# Patient Record
Sex: Male | Born: 1970 | Race: Black or African American | Hispanic: No | Marital: Married | Smoking: Never smoker
Health system: Southern US, Community
[De-identification: ages and names within clinical notes are randomized; demographics above are authoritative.]

## PROBLEM LIST (undated history)

## (undated) DIAGNOSIS — I10 Essential (primary) hypertension: Secondary | ICD-10-CM

## (undated) DIAGNOSIS — M5430 Sciatica, unspecified side: Secondary | ICD-10-CM

## (undated) DIAGNOSIS — R001 Bradycardia, unspecified: Secondary | ICD-10-CM

## (undated) DIAGNOSIS — L409 Psoriasis, unspecified: Secondary | ICD-10-CM

## (undated) DIAGNOSIS — M549 Dorsalgia, unspecified: Secondary | ICD-10-CM

## (undated) HISTORY — PX: SHOULDER SURGERY: SHX246

## (undated) HISTORY — DX: Essential (primary) hypertension: I10

---

## 1997-11-08 ENCOUNTER — Encounter: Admission: RE | Admit: 1997-11-08 | Discharge: 1997-11-08 | Payer: Self-pay | Admitting: *Deleted

## 1997-12-21 ENCOUNTER — Encounter: Admission: RE | Admit: 1997-12-21 | Discharge: 1997-12-21 | Payer: Self-pay | Admitting: *Deleted

## 1998-11-03 ENCOUNTER — Emergency Department (HOSPITAL_COMMUNITY): Admission: EM | Admit: 1998-11-03 | Discharge: 1998-11-03 | Payer: Self-pay

## 1999-06-16 ENCOUNTER — Emergency Department (HOSPITAL_COMMUNITY): Admission: EM | Admit: 1999-06-16 | Discharge: 1999-06-16 | Payer: Self-pay | Admitting: Emergency Medicine

## 2011-11-19 ENCOUNTER — Emergency Department (HOSPITAL_COMMUNITY)
Admission: EM | Admit: 2011-11-19 | Discharge: 2011-11-19 | Disposition: A | Discharge status: Home / Self Care | Payer: Self-pay | Attending: Emergency Medicine | Admitting: Emergency Medicine

## 2011-11-19 ENCOUNTER — Encounter (HOSPITAL_COMMUNITY): Payer: Self-pay | Admitting: *Deleted

## 2011-11-19 DIAGNOSIS — Y998 Other external cause status: Secondary | ICD-10-CM | POA: Insufficient documentation

## 2011-11-19 DIAGNOSIS — X503XXA Overexertion from repetitive movements, initial encounter: Secondary | ICD-10-CM | POA: Insufficient documentation

## 2011-11-19 DIAGNOSIS — T148XXA Other injury of unspecified body region, initial encounter: Secondary | ICD-10-CM

## 2011-11-19 DIAGNOSIS — IMO0002 Reserved for concepts with insufficient information to code with codable children: Secondary | ICD-10-CM | POA: Insufficient documentation

## 2011-11-19 DIAGNOSIS — Y9389 Activity, other specified: Secondary | ICD-10-CM | POA: Insufficient documentation

## 2011-11-19 DIAGNOSIS — M549 Dorsalgia, unspecified: Secondary | ICD-10-CM

## 2011-11-19 MED ORDER — DIAZEPAM 5 MG PO TABS: ORAL_TABLET | ORAL | Status: AC

## 2011-11-19 MED ORDER — KETOROLAC TROMETHAMINE 30 MG/ML IJ SOLN
INTRAMUSCULAR | Status: AC
  Administered 2011-11-19: 60 mg
  Filled 2011-11-19: qty 2

## 2011-11-19 MED ORDER — ONDANSETRON 4 MG PO TBDP: 4.0000 mg | ORAL_TABLET | Freq: Once | ORAL | Status: DC

## 2011-11-19 MED ORDER — KETOROLAC TROMETHAMINE 60 MG/2ML IM SOLN: 60.0000 mg | Freq: Once | INTRAMUSCULAR | Status: DC

## 2011-11-19 MED ORDER — PREDNISONE 20 MG PO TABS: 60.0000 mg | ORAL_TABLET | Freq: Once | ORAL | Status: DC

## 2011-11-19 MED ORDER — CEFTRIAXONE SODIUM 1 G IJ SOLR: 1.0000 g | Freq: Once | INTRAMUSCULAR | Status: DC

## 2011-11-19 MED ORDER — ACETAMINOPHEN-CODEINE #3 300-30 MG PO TABS: 1.0000 | ORAL_TABLET | Freq: Four times a day (QID) | ORAL | Status: AC | PRN

## 2011-11-19 MED ORDER — MORPHINE SULFATE 4 MG/ML IJ SOLN: 4.0000 mg | Freq: Once | INTRAMUSCULAR | Status: DC

## 2011-11-19 NOTE — ED Provider Notes (Signed)
History     CSN: [id number]  Arrival date & time 11/19/11  1905   First MD Initiated Contact with Patient 11/19/11 2053      Chief Complaint  Patient presents with  . Back Pain    (Consider location/radiation/quality/duration/timing/severity/associated sxs/prior treatment) HPI  Patient presents to ER complaining of lower back pain that was gradual onset, persistent and unchanging stating that Friday afternoon he was doing a lot of lifting of boxes filled with paper and twisting to put them away and by the time he got home Friday, 4 days ago he had aching in is lower back. Patient states that since then the pain has begun to radiate from around lower back towards groin. Pain is aggravated by bending from waist and twisting and mildly improved with lying down and taking ibuprofen. Patient states he last took ibuprofen yesterday. He denies any extremity numbness/tingling/weakness, loss of bowel or bladder function or saddle seat paresthesias. He denies abdominal pain or mass in groin.   History reviewed. No pertinent past medical history.  History reviewed. No pertinent past surgical history.  History reviewed. No pertinent family history.  History  Substance Use Topics  . Smoking status: Never Smoker   . Smokeless tobacco: Not on file  . Alcohol Use: No      Review of Systems  All other systems reviewed and are negative.    Allergies  Review of patient's allergies indicates no known allergies.  Home Medications   Current Outpatient Rx  Name Route Sig Dispense Refill  . ACETAMINOPHEN-CODEINE #3 300-30 MG PO TABS Oral Take 1-2 tablets by mouth every 6 (six) hours as needed for pain. 15 tablet 0  . DIAZEPAM 5 MG PO TABS  Take 1 tablet by mouth every 4-6 hours as needed for muscle relaxation 15 tablet 0    BP 126/84  Temp 98 F (36.7 C) (Oral)  Resp 16  SpO2 99%  Physical Exam  Nursing note and vitals reviewed. Constitutional: He is oriented to person, place, and  time. He appears well-developed and well-nourished. No distress.  HENT:  Head: Normocephalic and atraumatic.  Eyes: Conjunctivae are normal.  Neck: Normal range of motion. Neck supple.  Cardiovascular: Normal rate, regular rhythm, normal heart sounds and intact distal pulses.  Exam reveals no gallop and no friction rub.   No murmur heard. Pulmonary/Chest: Effort normal and breath sounds normal. No respiratory distress. He has no wheezes. He has no rales. He exhibits no tenderness.  Abdominal: Bowel sounds are normal. He exhibits no distension and no mass. There is no tenderness. There is no rebound and no guarding.  Genitourinary: Penis normal.       Inguinal mass or hernia. Testes NT.   Musculoskeletal: Normal range of motion. He exhibits tenderness. He exhibits no edema.       TTP of soft tissue of lower lumbar back with palpation but no skin changes or crepitous. Pain in lower back with bending and twisting from waist. 5/5 strength of bilateral UE and LE with normal reflexes. Negative straight leg raise.   Neurological: He is alert and oriented to person, place, and time. He has normal reflexes.  Skin: Skin is warm and dry. No rash noted. He is not diaphoretic. No erythema.  Psychiatric: He has a normal mood and affect.    ED Course  Procedures (including critical care time)  IM toradol.   Labs Reviewed - No data to display No results found.   1. Back pain   2.  Muscle strain       MDM  No red flags for back pain. No signs of symptoms of central cord compression or cauda equina. Symptoms consistent with straining of lower lumbar back. Ambulating without difficulty. No TTP of abdomen with out palpable masses.         Kendall West, [place] 11/19/11 2145

## 2011-11-19 NOTE — ED Notes (Signed)
Patient was lifting boxes and injured his back Friday.  Pain has gotten worse and now it is radiating to his groin area

## 2011-11-19 NOTE — ED Notes (Signed)
Medication given in L buttocks

## 2011-11-20 NOTE — ED Provider Notes (Signed)
Medical screening examination/treatment/procedure(s) were performed by non-physician practitioner and as supervising physician I was immediately available for consultation/collaboration.   Carleene Cooper III, MD 11/20/11 [phone]

## 2013-12-01 ENCOUNTER — Ambulatory Visit (INDEPENDENT_AMBULATORY_CARE_PROVIDER_SITE_OTHER): Payer: Self-pay | Admitting: Emergency Medicine

## 2013-12-01 ENCOUNTER — Encounter: Payer: Self-pay | Admitting: *Deleted

## 2013-12-01 VITALS — BP 118/84 | HR 67 | Temp 97.8°F | Resp 16 | Ht 71.5 in | Wt 242.0 lb

## 2013-12-01 DIAGNOSIS — Z0000 Encounter for general adult medical examination without abnormal findings: Secondary | ICD-10-CM

## 2013-12-01 NOTE — Progress Notes (Signed)
   Subjective:    Patient ID: Keith Austin, male    DOB: 07/25/1970, 43 y.o.   MRN: [id number] This chart was scribed for Collene GobbleSteven A Daub, MD by Julian HyMorgan Graham, ED Scribe. The patient was seen in Room 21. The patient's care was started at 5:07 PM.    HPI HPI Comments: Keith CopaSteven Archambeault is a 43 y.o. 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  Review of Systems  Constitutional: Negative for fever and chills.  Respiratory: Negative for shortness of breath.   Gastrointestinal: Negative for nausea and vomiting.  Neurological: Negative for weakness.   No past medical history on file. No past surgical history on file. No Known Allergies No current outpatient prescriptions on file.   No current facility-administered medications for this visit.   Objective:  Triage Vitals: BP 118/84  Pulse 67  Temp(Src) 97.8 F (36.6 C) (Oral)  Resp 16  Ht 5' 11.5" (1.816 m)  Wt 242 lb (109.77 kg)  BMI 33.29 kg/m2  SpO2 99%  Physical Exam .CONSTITUTIONAL: Well developed/well nourished HEAD: Normocephalic/atraumatic EYES: EOMI/PERRL ENMT: Mucous membranes moist NECK: supple no meningeal signs SPINE:entire spine nontender CV: S1/S2 noted, no murmurs/rubs/gallops noted LUNGS: Lungs are clear to auscultation bilaterally, no apparent distress ABDOMEN: soft, nontender, no rebound or guarding GU:no cva tenderness NEURO: Pt is awake/alert, moves all extremitiesx4 EXTREMITIES: pulses normal, full ROM SKIN: warm, color normal PSYCH: no abnormalities of mood noted  Assessment & Plan:  5:11 PM- Patient informed of current plan for treatment and evaluation and agrees with plan at this time.  His physical exam is normal and he qualifies for a 2 year card.

## 2014-03-16 ENCOUNTER — Other Ambulatory Visit: Payer: Self-pay | Admitting: Emergency Medicine

## 2014-03-16 LAB — CBC WITH DIFFERENTIAL/PLATELET
BASOS PCT: 0 % (ref 0–1)
Basophils Absolute: 0 10*3/uL (ref 0.0–0.1)
EOS ABS: 0.1 10*3/uL (ref 0.0–0.7)
EOS PCT: 1 % (ref 0–5)
HEMATOCRIT: 42.1 % (ref 39.0–52.0)
HEMOGLOBIN: 14 g/dL (ref 13.0–17.0)
LYMPHS ABS: 0.9 10*3/uL (ref 0.7–4.0)
Lymphocytes Relative: 12 % (ref 12–46)
MCH: 30.5 pg (ref 26.0–34.0)
MCHC: 33.3 g/dL (ref 30.0–36.0)
MCV: 91.7 fL (ref 78.0–100.0)
MONO ABS: 0.6 10*3/uL (ref 0.1–1.0)
MONOS PCT: 8 % (ref 3–12)
MPV: 10.1 fL (ref 9.4–12.4)
Neutro Abs: 5.8 10*3/uL (ref 1.7–7.7)
Neutrophils Relative %: 79 % — ABNORMAL HIGH (ref 43–77)
Platelets: 243 10*3/uL (ref 150–400)
RBC: 4.59 MIL/uL (ref 4.22–5.81)
RDW: 13.7 % (ref 11.5–15.5)
WBC: 7.3 10*3/uL (ref 4.0–10.5)

## 2014-03-16 LAB — COMPLETE METABOLIC PANEL WITH GFR
ALT: 29 U/L (ref 0–53)
AST: 29 U/L (ref 0–37)
Albumin: 4.5 g/dL (ref 3.5–5.2)
Alkaline Phosphatase: 49 U/L (ref 39–117)
BILIRUBIN TOTAL: 0.7 mg/dL (ref 0.2–1.2)
BUN: 14 mg/dL (ref 6–23)
CO2: 28 meq/L (ref 19–32)
CREATININE: 1.42 mg/dL — AB (ref 0.50–1.35)
Calcium: 9.3 mg/dL (ref 8.4–10.5)
Chloride: 102 mEq/L (ref 96–112)
GFR, Est African American: 68 mL/min
GFR, Est Non African American: 59 mL/min — ABNORMAL LOW
Glucose, Bld: 77 mg/dL (ref 70–99)
Potassium: 4.3 mEq/L (ref 3.5–5.3)
SODIUM: 138 meq/L (ref 135–145)
TOTAL PROTEIN: 7.5 g/dL (ref 6.0–8.3)

## 2016-11-26 ENCOUNTER — Other Ambulatory Visit: Payer: Self-pay | Admitting: Occupational Medicine

## 2016-11-26 ENCOUNTER — Ambulatory Visit: Payer: Self-pay

## 2016-11-26 DIAGNOSIS — M545 Low back pain: Secondary | ICD-10-CM

## 2017-03-20 ENCOUNTER — Ambulatory Visit: Payer: Self-pay | Admitting: Orthopedic Surgery

## 2017-03-20 NOTE — H&P (Signed)
Keith Austin is an 46 y.o. male.   Chief Complaint: back and right leg pain HPI: The patient is a 46 year old male who presents today for follow up of their back. The patient is being followed for their low back symptoms. They are now 16 weeks, 4 days out from injury (DOI 10/26/16). Symptoms reported today include: pain. Current treatment includes: home exercise program, activity modification, NSAIDs (Meloxicam) and Neurontin. The patient reports their current pain level to be 4 / 10 (but can be 10/10). The patient presents today following right L5 SNRB x 2 weeks. The patient has not gotten any relief of their symptoms with Cortisone injections. Note for "Follow-up back": The patient curently has light duty resctrictions of no lifting over 5lbs, no bending, stooping, or squatting, no prolonged sitting or standing, and sedentary work only.  Note:Patient reports temporary relief from the injection on the right. A selective nerve root block. The pain was significantly better now it has returned it is in the buttock thigh calf and into the top of the foot. Especially when he stands or lifts any objects. He is 4 months status post his injury. He reports no problems with his back prior to that. He is on a light duty position at work. His main problem is he has to walk long distances at the [facility] and that is intolerable for him he tolerates about 10 minutes of walking.  No past medical history on file.  No past surgical history on file.  No family history on file. Social History:  reports that  has never smoked. He does not have any smokeless tobacco history on file. He reports that he does not drink alcohol. His drug history is not on file.  Allergies: No Known Allergies   (Not in a hospital admission)  No results found for this or any previous visit (from the past 48 hour(s)). No results found.  Review of Systems  Constitutional: Negative.   HENT: Negative.   Eyes:  Negative.   Respiratory: Negative.   Cardiovascular: Negative.   Gastrointestinal: Negative.   Genitourinary: Negative.   Musculoskeletal: Positive for back pain.  Skin: Negative.   Neurological: Positive for sensory change and focal weakness.  Psychiatric/Behavioral: Negative.     There were no vitals taken for this visit. Physical Exam  Constitutional: He is oriented to person, place, and time. He appears well-developed.  HENT:  Head: Normocephalic.  Eyes: Pupils are equal, round, and reactive to light.  Neck: Normal range of motion.  Cardiovascular: Normal rate.  Respiratory: Effort normal.  GI: Soft.  Musculoskeletal:  Moderate distress. Tender in right proximal gluteus. Straight leg raise positive on the right. Negative on the left. EHL 4 +/5 on the right compared to the left. Decreased sensation L5 dermatome. No instability hips knees and ankles. He is normoreflexic except in the right Achilles which he has slightly diminished on the right compared to the left. No DVT. Pulses are intact. Pelvis is stable. Abdomen soft. No flank pain with percussion  Cervical spine full painless range. Motor is 5 5 in the upper extremities including biceps, triceps, brachioradialis, wrist extension, wrist flexion. Sensory exams intact no Hoffmann sign.  Neurological: He is alert and oriented to person, place, and time.  Skin: Skin is warm and dry.     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 He has on  clinical exam and L5 radiculopathy with EHL weakness and decreased sensation in the L5 dermatome. He had temporary relief from a selective nerve root block L5 on the right. He does have disc degeneration L5-S1 that is of moderate. He however has no back pain.  Repeated radiographs today 3 view demonstrating no instability at L5-S1. No pars defect he  has a moderate disc degeneration and disc space narrowing L5-S1 with narrow foraminal narrowing.  I discussed at 4 months status post his injury either living with his symptoms rating him in releasing him and referring him to pain management versus consideration of a microlumbar decompression L5-S1 on the right with a foraminotomy of of L5 decompressing the lateral recess and evaluating the disc and a possible discectomy at L5-S1. Risk and benefits discussed including bleeding infection no changes in symptoms worsening in symptoms need for fusion in the future DVT PE anesthetic complications etc.  Procedure would require overnight stay. 2 weeks for suture removal 6 weeks until light duty an option he would be at maximum medical improvement 12 weeks following the surgery.  Again at this point I would not recommend no further injections as they were temporary. He has exhausted physical therapy with which was not beneficial. And he does have a neurologic deficit at this point in time.  I would not recommend a concomitant fusion despite his disc space narrowing given that he has no back pain. And he has no instability. Patient would like to proceed with that procedure at this point in time we will petition for authorization.  Plan microlumbar decompression L5-S1 right  BISSELL, JACLYN M., PA-C  For Dr. Beane 03/20/2017, 8:42 AM   

## 2017-03-20 NOTE — H&P (View-Only) (Signed)
Keith Austin is an 46 y.o. male.   Chief Complaint: back and right leg pain HPI: The patient is a 46 year old male who presents today for follow up of their back. The patient is being followed for their low back symptoms. They are now 16 weeks, 4 days out from injury (DOI 10/26/16). Symptoms reported today include: pain. Current treatment includes: home exercise program, activity modification, NSAIDs (Meloxicam) and Neurontin. The patient reports their current pain level to be 4 / 10 (but can be 10/10). The patient presents today following right L5 SNRB x 2 weeks. The patient has not gotten any relief of their symptoms with Cortisone injections. Note for "Follow-up back": The patient curently has light duty resctrictions of no lifting over 5lbs, no bending, stooping, or squatting, no prolonged sitting or standing, and sedentary work only.  Note:Patient reports temporary relief from the injection on the right. A selective nerve root block. The pain was significantly better now it has returned it is in the buttock thigh calf and into the top of the foot. Especially when he stands or lifts any objects. He is 4 months status post his injury. He reports no problems with his back prior to that. He is on a light duty position at work. His main problem is he has to walk long distances at the [facility] and that is intolerable for him he tolerates about 10 minutes of walking.  No past medical history on file.  No past surgical history on file.  No family history on file. Social History:  reports that  has never smoked. He does not have any smokeless tobacco history on file. He reports that he does not drink alcohol. His drug history is not on file.  Allergies: No Known Allergies   (Not in a hospital admission)  No results found for this or any previous visit (from the past 48 hour(s)). No results found.  Review of Systems  Constitutional: Negative.   HENT: Negative.   Eyes:  Negative.   Respiratory: Negative.   Cardiovascular: Negative.   Gastrointestinal: Negative.   Genitourinary: Negative.   Musculoskeletal: Positive for back pain.  Skin: Negative.   Neurological: Positive for sensory change and focal weakness.  Psychiatric/Behavioral: Negative.     There were no vitals taken for this visit. Physical Exam  Constitutional: He is oriented to person, place, and time. He appears well-developed.  HENT:  Head: Normocephalic.  Eyes: Pupils are equal, round, and reactive to light.  Neck: Normal range of motion.  Cardiovascular: Normal rate.  Respiratory: Effort normal.  GI: Soft.  Musculoskeletal:  Moderate distress. Tender in right proximal gluteus. Straight leg raise positive on the right. Negative on the left. EHL 4 +/5 on the right compared to the left. Decreased sensation L5 dermatome. No instability hips knees and ankles. He is normoreflexic except in the right Achilles which he has slightly diminished on the right compared to the left. No DVT. Pulses are intact. Pelvis is stable. Abdomen soft. No flank pain with percussion  Cervical spine full painless range. Motor is 5 5 in the upper extremities including biceps, triceps, brachioradialis, wrist extension, wrist flexion. Sensory exams intact no Hoffmann sign.  Neurological: He is alert and oriented to person, place, and time.  Skin: Skin is warm and dry.     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 He has on  clinical exam and L5 radiculopathy with EHL weakness and decreased sensation in the L5 dermatome. He had temporary relief from a selective nerve root block L5 on the right. He does have disc degeneration L5-S1 that is of moderate. He however has no back pain.  Repeated radiographs today 3 view demonstrating no instability at L5-S1. No pars defect he  has a moderate disc degeneration and disc space narrowing L5-S1 with narrow foraminal narrowing.  I discussed at 4 months status post his injury either living with his symptoms rating him in releasing him and referring him to pain management versus consideration of a microlumbar decompression L5-S1 on the right with a foraminotomy of of L5 decompressing the lateral recess and evaluating the disc and a possible discectomy at L5-S1. Risk and benefits discussed including bleeding infection no changes in symptoms worsening in symptoms need for fusion in the future DVT PE anesthetic complications etc.  Procedure would require overnight stay. 2 weeks for suture removal 6 weeks until light duty an option he would be at maximum medical improvement 12 weeks following the surgery.  Again at this point I would not recommend no further injections as they were temporary. He has exhausted physical therapy with which was not beneficial. And he does have a neurologic deficit at this point in time.  I would not recommend a concomitant fusion despite his disc space narrowing given that he has no back pain. And he has no instability. Patient would like to proceed with that procedure at this point in time we will petition for authorization.  Plan microlumbar decompression L5-S1 right  Dorothy SparkBISSELL, Keith Austin M., PA-C  For Dr. Shelle IronBeane 03/20/2017, 8:42 AM

## 2017-04-04 ENCOUNTER — Other Ambulatory Visit (HOSPITAL_COMMUNITY): Payer: Self-pay | Admitting: Emergency Medicine

## 2017-04-04 NOTE — Patient Instructions (Signed)
Melina CopaSteven Bouch  04/04/2017   Your procedure is scheduled on: 04-10-17  Report to [facility]  Entrance Take [place]  elevators to 3rd floor to  Short Stay Center at 630AM.   Call this number if you have problems the morning of surgery [phone]   Remember: ONLY 1 PERSON MAY GO WITH YOU TO SHORT STAY TO GET  READY MORNING OF YOUR SURGERY.  Do not eat food or drink liquids :After Midnight.     Take these medicines the morning of surgery with A SIP OF WATER: none                                You may not have any metal on your body including hair pins and              piercings  Do not wear jewelry, make-up, lotions, powders or perfumes, deodorant                          Men may shave face and neck.   Do not bring valuables to the hospital. [facility] IS NOT             RESPONSIBLE   FOR VALUABLES.  Contacts, dentures or bridgework may not be worn into surgery.  Leave suitcase in the car. After surgery it may be brought to your room.                   Please read over the following fact sheets you were given: _____________________________________________________________________             [facility] - Preparing for Surgery Before surgery, you can play an important role.  Because skin is not sterile, your skin needs to be as free of germs as possible.  You can reduce the number of germs on your skin by washing with CHG (chlorahexidine gluconate) soap before surgery.  CHG is an antiseptic cleaner which kills germs and bonds with the skin to continue killing germs even after washing. Please DO NOT use if you have an allergy to CHG or antibacterial soaps.  If your skin becomes reddened/irritated stop using the CHG and inform your nurse when you arrive at Short Stay. Do not shave (including legs and underarms) for at least 48 hours prior to the first CHG shower.  You may shave your face/neck. Please follow these instructions carefully:  1.  Shower  with CHG Soap the night before surgery and the  morning of Surgery.  2.  If you choose to wash your hair, wash your hair first as usual with your  normal  shampoo.  3.  After you shampoo, rinse your hair and body thoroughly to remove the  shampoo.                           4.  Use CHG as you would any other liquid soap.  You can apply chg directly  to the skin and wash                       Gently with a scrungie or clean washcloth.  5.  Apply the CHG Soap to your body ONLY FROM THE NECK DOWN.   Do not use on face/  open                           Wound or open sores. Avoid contact with eyes, ears mouth and genitals (private parts).                       Wash face,  Genitals (private parts) with your normal soap.             6.  Wash thoroughly, paying special attention to the area where your surgery  will be performed.  7.  Thoroughly rinse your body with warm water from the neck down.  8.  DO NOT shower/wash with your normal soap after using and rinsing off  the CHG Soap.                9.  Pat yourself dry with a clean towel.            10.  Wear clean pajamas.            11.  Place clean sheets on your bed the night of your first shower and do not  sleep with pets. Day of Surgery : Do not apply any lotions/deodorants the morning of surgery.  Please wear clean clothes to the hospital/surgery center.  FAILURE TO FOLLOW THESE INSTRUCTIONS MAY RESULT IN THE CANCELLATION OF YOUR SURGERY PATIENT SIGNATURE_________________________________  NURSE SIGNATURE__________________________________  ________________________________________________________________________   Adam Phenix  An incentive spirometer is a tool that can help keep your lungs clear and active. This tool measures how well you are filling your lungs with each breath. Taking long deep breaths may help reverse or decrease the chance of developing breathing (pulmonary) problems (especially infection) following:  A long period  of time when you are unable to move or be active. BEFORE THE PROCEDURE   If the spirometer includes an indicator to show your best effort, your nurse or respiratory therapist will set it to a desired goal.  If possible, sit up straight or lean slightly forward. Try not to slouch.  Hold the incentive spirometer in an upright position. INSTRUCTIONS FOR USE  1. Sit on the edge of your bed if possible, or sit up as far as you can in bed or on a chair. 2. Hold the incentive spirometer in an upright position. 3. Breathe out normally. 4. Place the mouthpiece in your mouth and seal your lips tightly around it. 5. Breathe in slowly and as deeply as possible, raising the piston or the ball toward the top of the column. 6. Hold your breath for 3-5 seconds or for as long as possible. Allow the piston or ball to fall to the bottom of the column. 7. Remove the mouthpiece from your mouth and breathe out normally. 8. Rest for a few seconds and repeat Steps 1 through 7 at least 10 times every 1-2 hours when you are awake. Take your time and take a few normal breaths between deep breaths. 9. The spirometer may include an indicator to show your best effort. Use the indicator as a goal to work toward during each repetition. 10. After each set of 10 deep breaths, practice coughing to be sure your lungs are clear. If you have an incision (the cut made at the time of surgery), support your incision when coughing by placing a pillow or rolled up towels firmly against it. Once you are able to get out of bed, walk around indoors and  cough well. You may stop using the incentive spirometer when instructed by your caregiver.  RISKS AND COMPLICATIONS  Take your time so you do not get dizzy or light-headed.  If you are in pain, you may need to take or ask for pain medication before doing incentive spirometry. It is harder to take a deep breath if you are having pain. AFTER USE  Rest and breathe slowly and easily.  It  can be helpful to keep track of a log of your progress. Your caregiver can provide you with a simple table to help with this. If you are using the spirometer at home, follow these instructions: [place] IF:   You are having difficultly using the spirometer.  You have trouble using the spirometer as often as instructed.  Your pain medication is not giving enough relief while using the spirometer.  You develop fever of 100.5 F (38.1 C) or higher. SEEK IMMEDIATE MEDICAL CARE IF:   You cough up bloody sputum that had not been present before.  You develop fever of 102 F (38.9 C) or greater.  You develop worsening pain at or near the incision site. MAKE SURE YOU:   Understand these instructions.  Will watch your condition.  Will get help right away if you are not doing well or get worse. Document Released: 08/20/2006 Document Revised: 07/02/2011 Document Reviewed: 10/21/2006 [facility] Patient Information 2014 [place], [place].   ________________________________________________________________________

## 2017-04-05 ENCOUNTER — Encounter (HOSPITAL_COMMUNITY)
Admission: RE | Admit: 2017-04-05 | Discharge: 2017-04-05 | Disposition: A | Discharge status: Home / Self Care | Payer: Worker's Compensation | Source: Ambulatory Visit | Attending: Specialist | Admitting: Specialist

## 2017-04-05 ENCOUNTER — Other Ambulatory Visit: Payer: Self-pay

## 2017-04-05 ENCOUNTER — Ambulatory Visit (HOSPITAL_COMMUNITY)
Admission: RE | Admit: 2017-04-05 | Discharge: 2017-04-05 | Disposition: A | Discharge status: Home / Self Care | Payer: Self-pay | Source: Ambulatory Visit | Attending: Orthopedic Surgery | Admitting: Orthopedic Surgery

## 2017-04-05 ENCOUNTER — Encounter (HOSPITAL_COMMUNITY): Payer: Self-pay

## 2017-04-05 DIAGNOSIS — M5126 Other intervertebral disc displacement, lumbar region: Secondary | ICD-10-CM

## 2017-04-05 DIAGNOSIS — M5127 Other intervertebral disc displacement, lumbosacral region: Secondary | ICD-10-CM | POA: Diagnosis not present

## 2017-04-05 DIAGNOSIS — Z01818 Encounter for other preprocedural examination: Secondary | ICD-10-CM | POA: Insufficient documentation

## 2017-04-05 DIAGNOSIS — M4807 Spinal stenosis, lumbosacral region: Secondary | ICD-10-CM | POA: Insufficient documentation

## 2017-04-05 HISTORY — DX: Dorsalgia, unspecified: M54.9

## 2017-04-05 HISTORY — DX: Sciatica, unspecified side: M54.30

## 2017-04-05 HISTORY — DX: Bradycardia, unspecified: R00.1

## 2017-04-05 HISTORY — DX: Psoriasis, unspecified: L40.9

## 2017-04-05 LAB — BASIC METABOLIC PANEL
Anion gap: 6 (ref 5–15)
BUN: 16 mg/dL (ref 6–20)
CALCIUM: 9.7 mg/dL (ref 8.9–10.3)
CO2: 29 mmol/L (ref 22–32)
CREATININE: 1.4 mg/dL — AB (ref 0.61–1.24)
Chloride: 107 mmol/L (ref 101–111)
GFR calc Af Amer: >60 mL/min (ref >60)
GFR calc non Af Amer: 58 mL/min — ABNORMAL LOW (ref >60)
GLUCOSE: 93 mg/dL (ref 65–99)
Potassium: 4.5 mmol/L (ref 3.5–5.1)
Sodium: 142 mmol/L (ref 135–145)

## 2017-04-05 LAB — CBC
HCT: 43.5 % (ref 39.0–52.0)
HEMOGLOBIN: 14.3 g/dL (ref 13.0–17.0)
MCH: 31.4 pg (ref 26.0–34.0)
MCHC: 32.9 g/dL (ref 30.0–36.0)
MCV: 95.6 fL (ref 78.0–100.0)
PLATELETS: 228 10*3/uL (ref 150–400)
RBC: 4.55 MIL/uL (ref 4.22–5.81)
RDW: 13.5 % (ref 11.5–15.5)
WBC: 6.9 10*3/uL (ref 4.0–10.5)

## 2017-04-05 LAB — SURGICAL PCR SCREEN
MRSA, PCR: NEGATIVE
Staphylococcus aureus: POSITIVE — AB

## 2017-04-08 ENCOUNTER — Ambulatory Visit: Payer: Self-pay | Admitting: Orthopedic Surgery

## 2017-04-10 ENCOUNTER — Encounter (HOSPITAL_COMMUNITY)
Admission: RE | Disposition: A | Discharge status: Home / Self Care | Payer: Self-pay | Source: Ambulatory Visit | Attending: Specialist

## 2017-04-10 ENCOUNTER — Ambulatory Visit (HOSPITAL_COMMUNITY)
Admission: RE | Admit: 2017-04-10 | Discharge: 2017-04-11 | Disposition: A | Discharge status: Home / Self Care | Payer: Worker's Compensation | Source: Ambulatory Visit | Attending: Specialist | Admitting: Specialist

## 2017-04-10 ENCOUNTER — Ambulatory Visit (HOSPITAL_COMMUNITY): Payer: Worker's Compensation

## 2017-04-10 ENCOUNTER — Ambulatory Visit (HOSPITAL_COMMUNITY): Payer: Worker's Compensation | Admitting: Certified Registered Nurse Anesthetist

## 2017-04-10 ENCOUNTER — Other Ambulatory Visit: Payer: Self-pay

## 2017-04-10 ENCOUNTER — Encounter (HOSPITAL_COMMUNITY): Payer: Self-pay

## 2017-04-10 DIAGNOSIS — Z79899 Other long term (current) drug therapy: Secondary | ICD-10-CM | POA: Insufficient documentation

## 2017-04-10 DIAGNOSIS — L409 Psoriasis, unspecified: Secondary | ICD-10-CM | POA: Insufficient documentation

## 2017-04-10 DIAGNOSIS — M5117 Intervertebral disc disorders with radiculopathy, lumbosacral region: Secondary | ICD-10-CM | POA: Insufficient documentation

## 2017-04-10 DIAGNOSIS — M4807 Spinal stenosis, lumbosacral region: Secondary | ICD-10-CM | POA: Insufficient documentation

## 2017-04-10 DIAGNOSIS — M48061 Spinal stenosis, lumbar region without neurogenic claudication: Secondary | ICD-10-CM | POA: Diagnosis present

## 2017-04-10 DIAGNOSIS — Z419 Encounter for procedure for purposes other than remedying health state, unspecified: Secondary | ICD-10-CM

## 2017-04-10 DIAGNOSIS — M5126 Other intervertebral disc displacement, lumbar region: Secondary | ICD-10-CM

## 2017-04-10 HISTORY — PX: LUMBAR LAMINECTOMY/DECOMPRESSION MICRODISCECTOMY: SHX5026

## 2017-04-10 SURGERY — LUMBAR LAMINECTOMY/DECOMPRESSION MICRODISCECTOMY 1 LEVEL
Anesthesia: General | Laterality: Right

## 2017-04-10 MED ORDER — ACETAMINOPHEN 10 MG/ML IV SOLN
INTRAVENOUS | Status: AC
  Filled 2017-04-10: qty 100

## 2017-04-10 MED ORDER — METHOCARBAMOL 500 MG PO TABS: 500.0000 mg | ORAL_TABLET | Freq: Four times a day (QID) | ORAL | 1 refills | Status: DC | PRN

## 2017-04-10 MED ORDER — OXYCODONE HCL 5 MG PO TABS
ORAL_TABLET | ORAL | Status: AC
  Filled 2017-04-10: qty 1

## 2017-04-10 MED ORDER — ONDANSETRON HCL 4 MG/2ML IJ SOLN
INTRAMUSCULAR | Status: AC
  Filled 2017-04-10: qty 2

## 2017-04-10 MED ORDER — ACETAMINOPHEN 10 MG/ML IV SOLN
1000.0000 mg | INTRAVENOUS | Status: AC
  Administered 2017-04-10: 1000 mg via INTRAVENOUS

## 2017-04-10 MED ORDER — OXYCODONE-ACETAMINOPHEN 5-325 MG PO TABS
1.0000 | ORAL_TABLET | ORAL | Status: DC | PRN
  Administered 2017-04-10 – 2017-04-11 (×3): 1 via ORAL
  Filled 2017-04-10 (×3): qty 1

## 2017-04-10 MED ORDER — BUPIVACAINE-EPINEPHRINE 0.5% -1:200000 IJ SOLN
INTRAMUSCULAR | Status: DC | PRN
  Administered 2017-04-10: 12 mL

## 2017-04-10 MED ORDER — ONDANSETRON HCL 4 MG/2ML IJ SOLN
INTRAMUSCULAR | Status: DC | PRN
  Administered 2017-04-10: 4 mg via INTRAVENOUS

## 2017-04-10 MED ORDER — PHENOL 1.4 % MT LIQD: 1.0000 | OROMUCOSAL | Status: DC | PRN

## 2017-04-10 MED ORDER — DEXAMETHASONE SODIUM PHOSPHATE 10 MG/ML IJ SOLN
INTRAMUSCULAR | Status: DC | PRN
  Administered 2017-04-10: 5 mg via INTRAVENOUS

## 2017-04-10 MED ORDER — THROMBIN (RECOMBINANT) 5000 UNITS EX SOLR
CUTANEOUS | Status: AC
  Filled 2017-04-10: qty 10000

## 2017-04-10 MED ORDER — ACETAMINOPHEN 325 MG PO TABS: 650.0000 mg | ORAL_TABLET | ORAL | Status: DC | PRN

## 2017-04-10 MED ORDER — DOCUSATE SODIUM 100 MG PO CAPS: 100.0000 mg | ORAL_CAPSULE | Freq: Two times a day (BID) | ORAL | 1 refills | Status: DC | PRN

## 2017-04-10 MED ORDER — ACETAMINOPHEN 650 MG RE SUPP: 650.0000 mg | RECTAL | Status: DC | PRN

## 2017-04-10 MED ORDER — PROPOFOL 10 MG/ML IV BOLUS
INTRAVENOUS | Status: AC
  Filled 2017-04-10: qty 20

## 2017-04-10 MED ORDER — POLYETHYLENE GLYCOL 3350 17 G PO PACK: 17.0000 g | PACK | Freq: Every day | ORAL | Status: DC | PRN

## 2017-04-10 MED ORDER — BISACODYL 5 MG PO TBEC: 5.0000 mg | DELAYED_RELEASE_TABLET | Freq: Every day | ORAL | Status: DC | PRN

## 2017-04-10 MED ORDER — THROMBIN (RECOMBINANT) 5000 UNITS EX SOLR
CUTANEOUS | Status: DC | PRN
  Administered 2017-04-10 (×2): 5 mL via TOPICAL

## 2017-04-10 MED ORDER — CEFAZOLIN SODIUM-DEXTROSE 2-4 GM/100ML-% IV SOLN
INTRAVENOUS | Status: AC
  Filled 2017-04-10: qty 100

## 2017-04-10 MED ORDER — OXYCODONE-ACETAMINOPHEN 5-325 MG PO TABS: 1.0000 | ORAL_TABLET | ORAL | 0 refills | Status: DC | PRN

## 2017-04-10 MED ORDER — ALUM & MAG HYDROXIDE-SIMETH 200-200-20 MG/5ML PO SUSP: 30.0000 mL | Freq: Four times a day (QID) | ORAL | Status: DC | PRN

## 2017-04-10 MED ORDER — MIDAZOLAM HCL 2 MG/2ML IJ SOLN
INTRAMUSCULAR | Status: AC
  Filled 2017-04-10: qty 2

## 2017-04-10 MED ORDER — RISAQUAD PO CAPS
1.0000 | ORAL_CAPSULE | Freq: Every day | ORAL | Status: DC
  Administered 2017-04-10 – 2017-04-11 (×2): 1 via ORAL
  Filled 2017-04-10 (×2): qty 1

## 2017-04-10 MED ORDER — KCL IN DEXTROSE-NACL 20-5-0.45 MEQ/L-%-% IV SOLN
INTRAVENOUS | Status: AC
  Administered 2017-04-10: 12:00:00 via INTRAVENOUS
  Filled 2017-04-10: qty 1000

## 2017-04-10 MED ORDER — CEFAZOLIN SODIUM-DEXTROSE 2-4 GM/100ML-% IV SOLN
2.0000 g | INTRAVENOUS | Status: AC
  Administered 2017-04-10: 2 g via INTRAVENOUS

## 2017-04-10 MED ORDER — MAGNESIUM CITRATE PO SOLN: 1.0000 | Freq: Once | ORAL | Status: DC | PRN

## 2017-04-10 MED ORDER — HYDROMORPHONE HCL 1 MG/ML IJ SOLN: 0.2500 mg | INTRAMUSCULAR | Status: DC | PRN

## 2017-04-10 MED ORDER — PHENYLEPHRINE 40 MCG/ML (10ML) SYRINGE FOR IV PUSH (FOR BLOOD PRESSURE SUPPORT)
PREFILLED_SYRINGE | INTRAVENOUS | Status: DC | PRN
  Administered 2017-04-10: 80 ug via INTRAVENOUS
  Administered 2017-04-10: 120 ug via INTRAVENOUS
  Administered 2017-04-10 (×2): 80 ug via INTRAVENOUS
  Administered 2017-04-10: 120 ug via INTRAVENOUS
  Administered 2017-04-10 (×3): 80 ug via INTRAVENOUS
  Administered 2017-04-10: 120 ug via INTRAVENOUS

## 2017-04-10 MED ORDER — MIDAZOLAM HCL 5 MG/5ML IJ SOLN
INTRAMUSCULAR | Status: DC | PRN
  Administered 2017-04-10: 2 mg via INTRAVENOUS

## 2017-04-10 MED ORDER — GABAPENTIN 300 MG PO CAPS: 300.0000 mg | ORAL_CAPSULE | Freq: Three times a day (TID) | ORAL | Status: DC | PRN

## 2017-04-10 MED ORDER — CLINDAMYCIN PHOSPHATE 900 MG/50ML IV SOLN
INTRAVENOUS | Status: AC
Start: 2017-04-10 — End: 2017-04-10
  Filled 2017-04-10: qty 50

## 2017-04-10 MED ORDER — CEFAZOLIN SODIUM-DEXTROSE 2-4 GM/100ML-% IV SOLN
2.0000 g | Freq: Three times a day (TID) | INTRAVENOUS | Status: AC
  Administered 2017-04-10 – 2017-04-11 (×3): 2 g via INTRAVENOUS
  Filled 2017-04-10 (×3): qty 100

## 2017-04-10 MED ORDER — DOCUSATE SODIUM 100 MG PO CAPS
100.0000 mg | ORAL_CAPSULE | Freq: Two times a day (BID) | ORAL | Status: DC
  Administered 2017-04-10 – 2017-04-11 (×2): 100 mg via ORAL
  Filled 2017-04-10 (×2): qty 1

## 2017-04-10 MED ORDER — POLYMYXIN B SULFATE 500000 UNITS IJ SOLR
INTRAMUSCULAR | Status: AC
  Filled 2017-04-10: qty 500000

## 2017-04-10 MED ORDER — HYDROMORPHONE HCL 1 MG/ML IJ SOLN: 1.0000 mg | INTRAMUSCULAR | Status: DC | PRN

## 2017-04-10 MED ORDER — ONDANSETRON HCL 4 MG PO TABS: 4.0000 mg | ORAL_TABLET | Freq: Four times a day (QID) | ORAL | Status: DC | PRN

## 2017-04-10 MED ORDER — SODIUM CHLORIDE 0.9 % IV SOLN
INTRAVENOUS | Status: DC | PRN
  Administered 2017-04-10: 500 mL

## 2017-04-10 MED ORDER — OXYCODONE HCL 5 MG PO TABS
5.0000 mg | ORAL_TABLET | ORAL | Status: DC | PRN
  Administered 2017-04-10: 5 mg via ORAL

## 2017-04-10 MED ORDER — METHOCARBAMOL 500 MG PO TABS
500.0000 mg | ORAL_TABLET | Freq: Four times a day (QID) | ORAL | Status: DC | PRN
  Administered 2017-04-11: 500 mg via ORAL
  Filled 2017-04-10: qty 1

## 2017-04-10 MED ORDER — LACTATED RINGERS IV SOLN
INTRAVENOUS | Status: DC
  Administered 2017-04-10 (×2): via INTRAVENOUS

## 2017-04-10 MED ORDER — ROCURONIUM BROMIDE 10 MG/ML (PF) SYRINGE
PREFILLED_SYRINGE | INTRAVENOUS | Status: DC | PRN
  Administered 2017-04-10: 10 mg via INTRAVENOUS
  Administered 2017-04-10: 50 mg via INTRAVENOUS

## 2017-04-10 MED ORDER — KCL IN DEXTROSE-NACL 20-5-0.45 MEQ/L-%-% IV SOLN
INTRAVENOUS | Status: AC
  Filled 2017-04-10: qty 1000

## 2017-04-10 MED ORDER — BUPIVACAINE-EPINEPHRINE (PF) 0.5% -1:200000 IJ SOLN
INTRAMUSCULAR | Status: AC
  Filled 2017-04-10: qty 30

## 2017-04-10 MED ORDER — EPHEDRINE SULFATE-NACL 50-0.9 MG/10ML-% IV SOSY
PREFILLED_SYRINGE | INTRAVENOUS | Status: DC | PRN
  Administered 2017-04-10: 5 mg via INTRAVENOUS
  Administered 2017-04-10: 10 mg via INTRAVENOUS

## 2017-04-10 MED ORDER — ONDANSETRON HCL 4 MG/2ML IJ SOLN: 4.0000 mg | Freq: Four times a day (QID) | INTRAMUSCULAR | Status: DC | PRN

## 2017-04-10 MED ORDER — PHENYLEPHRINE 40 MCG/ML (10ML) SYRINGE FOR IV PUSH (FOR BLOOD PRESSURE SUPPORT)
PREFILLED_SYRINGE | INTRAVENOUS | Status: AC
  Filled 2017-04-10: qty 20

## 2017-04-10 MED ORDER — LIP MEDEX EX OINT
TOPICAL_OINTMENT | CUTANEOUS | Status: AC
  Administered 2017-04-10: 16:00:00
  Filled 2017-04-10: qty 7

## 2017-04-10 MED ORDER — OXYCODONE-ACETAMINOPHEN 5-325 MG PO TABS: 1.0000 | ORAL_TABLET | ORAL | Status: DC | PRN

## 2017-04-10 MED ORDER — PROMETHAZINE HCL 25 MG/ML IJ SOLN: 6.2500 mg | INTRAMUSCULAR | Status: DC | PRN

## 2017-04-10 MED ORDER — PROPOFOL 10 MG/ML IV BOLUS
INTRAVENOUS | Status: DC | PRN
  Administered 2017-04-10: 180 mg via INTRAVENOUS

## 2017-04-10 MED ORDER — SUGAMMADEX SODIUM 200 MG/2ML IV SOLN
INTRAVENOUS | Status: DC | PRN
  Administered 2017-04-10: 225 mg via INTRAVENOUS

## 2017-04-10 MED ORDER — OXYCODONE HCL 5 MG PO TABS
10.0000 mg | ORAL_TABLET | ORAL | Status: DC | PRN
  Administered 2017-04-11: 10 mg via ORAL
  Filled 2017-04-10: qty 2

## 2017-04-10 MED ORDER — CLINDAMYCIN PHOSPHATE 900 MG/50ML IV SOLN
900.0000 mg | INTRAVENOUS | Status: AC
  Administered 2017-04-10: 900 mg via INTRAVENOUS

## 2017-04-10 MED ORDER — FENTANYL CITRATE (PF) 250 MCG/5ML IJ SOLN
INTRAMUSCULAR | Status: AC
  Filled 2017-04-10: qty 5

## 2017-04-10 MED ORDER — METHOCARBAMOL 1000 MG/10ML IJ SOLN
500.0000 mg | Freq: Four times a day (QID) | INTRAVENOUS | Status: DC | PRN
  Administered 2017-04-10: 15:00:00 500 mg via INTRAVENOUS
  Filled 2017-04-10: qty 550

## 2017-04-10 MED ORDER — SUCCINYLCHOLINE CHLORIDE 20 MG/ML IJ SOLN
INTRAMUSCULAR | Status: DC | PRN
  Administered 2017-04-10: 120 mg via INTRAVENOUS

## 2017-04-10 MED ORDER — FENTANYL CITRATE (PF) 100 MCG/2ML IJ SOLN
INTRAMUSCULAR | Status: DC | PRN
  Administered 2017-04-10 (×2): 100 ug via INTRAVENOUS

## 2017-04-10 MED ORDER — MENTHOL 3 MG MT LOZG
1.0000 | LOZENGE | OROMUCOSAL | Status: DC | PRN
  Administered 2017-04-10 – 2017-04-11 (×2): 3 mg via ORAL
  Filled 2017-04-10 (×2): qty 9

## 2017-04-10 SURGICAL SUPPLY — 54 items
AGENT HMST SPONGE THK3/8 (HEMOSTASIS)
BAG SPEC THK2 15X12 ZIP CLS (MISCELLANEOUS)
BAG ZIPLOCK 12X15 (MISCELLANEOUS) IMPLANT
CLEANER TIP ELECTROSURG 2X2 (MISCELLANEOUS) ×3 IMPLANT
CLOSURE WOUND 1/2 X4 (GAUZE/BANDAGES/DRESSINGS) ×1
CLOTH 2% CHLOROHEXIDINE 3PK (PERSONAL CARE ITEMS) ×3 IMPLANT
COVER SURGICAL LIGHT HANDLE (MISCELLANEOUS) ×3 IMPLANT
DRAPE MICROSCOPE LEICA (MISCELLANEOUS) ×3 IMPLANT
DRAPE POUCH INSTRU U-SHP 10X18 (DRAPES) ×3 IMPLANT
DRAPE SHEET LG 3/4 BI-LAMINATE (DRAPES) ×3 IMPLANT
DRAPE SURG 17X11 SM STRL (DRAPES) ×3 IMPLANT
DRAPE UTILITY XL STRL (DRAPES) ×3 IMPLANT
DRESSING AQUACEL AG SP 3.5X6 (GAUZE/BANDAGES/DRESSINGS) IMPLANT
DRSG AQUACEL AG ADV 3.5X 4 (GAUZE/BANDAGES/DRESSINGS) IMPLANT
DRSG AQUACEL AG ADV 3.5X 6 (GAUZE/BANDAGES/DRESSINGS) IMPLANT
DRSG AQUACEL AG SP 3.5X6 (GAUZE/BANDAGES/DRESSINGS) ×3
DURAPREP 26ML APPLICATOR (WOUND CARE) ×3 IMPLANT
DURASEAL SPINE SEALANT 3ML (MISCELLANEOUS) IMPLANT
ELECT BLADE TIP CTD 4 INCH (ELECTRODE) IMPLANT
ELECT REM PT RETURN 15FT ADLT (MISCELLANEOUS) ×3 IMPLANT
GLOVE BIOGEL PI IND STRL 7.0 (GLOVE) ×1 IMPLANT
GLOVE BIOGEL PI INDICATOR 7.0 (GLOVE) ×2
GLOVE SURG SS PI 7.0 STRL IVOR (GLOVE) ×3 IMPLANT
GLOVE SURG SS PI 7.5 STRL IVOR (GLOVE) ×3 IMPLANT
GLOVE SURG SS PI 8.0 STRL IVOR (GLOVE) ×6 IMPLANT
GOWN STRL REUS W/TWL XL LVL3 (GOWN DISPOSABLE) ×6 IMPLANT
HEMOSTAT SPONGE AVITENE ULTRA (HEMOSTASIS) IMPLANT
IV CATH 14GX2 1/4 (CATHETERS) ×3 IMPLANT
KIT BASIN OR (CUSTOM PROCEDURE TRAY) ×3 IMPLANT
KIT POSITIONING SURG ANDREWS (MISCELLANEOUS) ×3 IMPLANT
MANIFOLD NEPTUNE II (INSTRUMENTS) ×3 IMPLANT
NDL SPNL 18GX3.5 QUINCKE PK (NEEDLE) ×2 IMPLANT
NEEDLE SPNL 18GX3.5 QUINCKE PK (NEEDLE) ×6 IMPLANT
PACK LAMINECTOMY ORTHO (CUSTOM PROCEDURE TRAY) ×3 IMPLANT
PATTIES SURGICAL .5 X.5 (GAUZE/BANDAGES/DRESSINGS) ×2 IMPLANT
PATTIES SURGICAL .75X.75 (GAUZE/BANDAGES/DRESSINGS) ×2 IMPLANT
PATTIES SURGICAL 1X1 (DISPOSABLE) IMPLANT
RUBBERBAND STERILE (MISCELLANEOUS) ×6 IMPLANT
SPONGE SURGIFOAM ABS GEL 100 (HEMOSTASIS) ×3 IMPLANT
STAPLER VISISTAT (STAPLE) IMPLANT
STRIP CLOSURE SKIN 1/2X4 (GAUZE/BANDAGES/DRESSINGS) ×2 IMPLANT
SUT NURALON 4 0 TR CR/8 (SUTURE) IMPLANT
SUT PROLENE 3 0 PS 2 (SUTURE) ×3 IMPLANT
SUT VIC AB 1 CT1 27 (SUTURE) ×3
SUT VIC AB 1 CT1 27XBRD ANTBC (SUTURE) IMPLANT
SUT VIC AB 1-0 CT2 27 (SUTURE) ×3 IMPLANT
SUT VIC AB 2-0 CT1 27 (SUTURE)
SUT VIC AB 2-0 CT1 TAPERPNT 27 (SUTURE) IMPLANT
SUT VIC AB 2-0 CT2 27 (SUTURE) ×3 IMPLANT
SYR 3ML LL SCALE MARK (SYRINGE) IMPLANT
TAPE STRIPS DRAPE STRL (GAUZE/BANDAGES/DRESSINGS) ×2 IMPLANT
TOWEL OR 17X26 10 PK STRL BLUE (TOWEL DISPOSABLE) ×3 IMPLANT
TOWEL OR NON WOVEN STRL DISP B (DISPOSABLE) IMPLANT
YANKAUER SUCT BULB TIP NO VENT (SUCTIONS) ×2 IMPLANT

## 2017-04-10 NOTE — Anesthesia Procedure Notes (Signed)
Procedure Name: Intubation Performed by: Gean Maidens, CRNA Pre-anesthesia Checklist: Patient identified, Emergency Drugs available, Suction available, Patient being monitored and Timeout performed Patient Re-evaluated:Patient Re-evaluated prior to induction Oxygen Delivery Method: Circle system utilized Preoxygenation: Pre-oxygenation with 100% oxygen Induction Type: IV induction Ventilation: Mask ventilation without difficulty Laryngoscope Size: Mac and 4 Grade View: Grade I Tube type: Oral Tube size: 7.5 mm Number of attempts: 1 Airway Equipment and Method: Stylet Placement Confirmation: ETT inserted through vocal cords under direct vision,  positive ETCO2,  CO2 detector and breath sounds checked- equal and bilateral Secured at: 23 cm Tube secured with: Tape Dental Injury: Teeth and Oropharynx as per pre-operative assessment

## 2017-04-10 NOTE — Interval H&P Note (Signed)
History and Physical Interval Note:  04/10/2017 8:18 AM  Melina CopaSteven Calbert  has presented today for surgery, with the diagnosis of HNP, stenosis L5-S1 right  The various methods of treatment have been discussed with the patient and family. After consideration of risks, benefits and other options for treatment, the patient has consented to  Procedure(s) with comments: Microlumbar Decompression L5-S1 right (Right) - 90 mins as a surgical intervention .  The patient's history has been reviewed, patient examined, no change in status, stable for surgery.  I have reviewed the patient's chart and labs.  Questions were answered to the patient's satisfaction.     Onis Markoff C

## 2017-04-10 NOTE — Transfer of Care (Signed)
Immediate Anesthesia Transfer of Care Note  Patient: Keith Austin  Procedure(s) Performed: Microlumbar Decompression L5-S1 right; bilateral foraminotomy (Right )  Patient Location: PACU  Anesthesia Type:General  Level of Consciousness: sedated, patient cooperative and responds to stimulation  Airway & Oxygen Therapy: Patient Spontanous Breathing and Patient connected to face mask oxygen  Post-op Assessment: Report given to RN and Post -op Vital signs reviewed and stable  Post vital signs: Reviewed and stable  Last Vitals:  Vitals:   04/10/17 0633  BP: (!) 158/93  Pulse: 91  Resp: 18  Temp: 36.6 C  SpO2: 100%    Last Pain:  Vitals:   04/10/17 0633  TempSrc: Oral         Complications: No apparent anesthesia complications

## 2017-04-10 NOTE — Anesthesia Preprocedure Evaluation (Signed)
Anesthesia Evaluation  Patient identified by MRN, date of birth, ID band Patient awake    Reviewed: Allergy & Precautions, NPO status , Patient's Chart, lab work & pertinent test results  Airway Mallampati: II  TM Distance: >3 FB Neck ROM: Full    Dental no notable dental hx.    Pulmonary neg pulmonary ROS,    Pulmonary exam normal breath sounds clear to auscultation       Cardiovascular negative cardio ROS Normal cardiovascular exam Rhythm:Regular Rate:Normal     Neuro/Psych negative neurological ROS  negative psych ROS   GI/Hepatic negative GI ROS, Neg liver ROS,   Endo/Other  negative endocrine ROS  Renal/GU negative Renal ROS  negative genitourinary   Musculoskeletal negative musculoskeletal ROS (+)   Abdominal   Peds negative pediatric ROS (+)  Hematology negative hematology ROS (+)   Anesthesia Other Findings   Reproductive/Obstetrics negative OB ROS                             Anesthesia Physical Anesthesia Plan  ASA: I  Anesthesia Plan: General   Post-op Pain Management:    Induction: Intravenous  PONV Risk Score and Plan: 2 and Ondansetron and Dexamethasone  Airway Management Planned: Oral ETT  Additional Equipment:   Intra-op Plan:   Post-operative Plan: Extubation in OR  Informed Consent: I have reviewed the patients History and Physical, chart, labs and discussed the procedure including the risks, benefits and alternatives for the proposed anesthesia with the patient or authorized representative who has indicated his/her understanding and acceptance.     Dental advisory given  Plan Discussed with: CRNA and Surgeon  Anesthesia Plan Comments:         Anesthesia Quick Evaluation  

## 2017-04-10 NOTE — Brief Op Note (Signed)
04/10/2017  10:05 AM  PATIENT:  Melina CopaSteven Bugh  46 y.o. male  PRE-OPERATIVE DIAGNOSIS:  HNP, stenosis L5-S1 right  POST-OPERATIVE DIAGNOSIS:  HNP, stenosis L5-S1 right  PROCEDURE:  Procedure(s) with comments: Microlumbar Decompression L5-S1 right; bilateral foraminotomy (Right) - 90 mins  SURGEON:  Surgeon(s) and Role:    Jene Every* Nyari Olsson, MD - Primary  PHYSICIAN ASSISTANT:   ASSISTANTS: Bissell   ANESTHESIA:   general  EBL:  25 mL   BLOOD ADMINISTERED:none  DRAINS: none   LOCAL MEDICATIONS USED:  MARCAINE     SPECIMEN:  Source of Specimen:  L5S1  DISPOSITION OF SPECIMEN:  PATHOLOGY  COUNTS:  YES  TOURNIQUET:  * No tourniquets in log *  DICTATION: .Other Dictation: Dictation Number [id number]  PLAN OF CARE: Admit for overnight observation  PATIENT DISPOSITION:  PACU - hemodynamically stable.   Delay start of Pharmacological VTE agent (>24hrs) due to surgical blood loss or risk of bleeding: yes

## 2017-04-10 NOTE — Anesthesia Postprocedure Evaluation (Signed)
Anesthesia Post Note  Patient: Keith CopaSteven Endicott  Procedure(s) Performed: Microlumbar Decompression L5-S1 right; bilateral foraminotomy (Right )     Patient location during evaluation: PACU Anesthesia Type: General Level of consciousness: awake and alert Pain management: pain level controlled Vital Signs Assessment: post-procedure vital signs reviewed and stable Respiratory status: spontaneous breathing, nonlabored ventilation, respiratory function stable and patient connected to nasal cannula oxygen Cardiovascular status: blood pressure returned to baseline and stable Postop Assessment: no apparent nausea or vomiting Anesthetic complications: no    Last Vitals:  Vitals:   04/10/17 1115 04/10/17 1130  BP: (!) 138/98 122/80  Pulse: 82 72  Resp: 12 12  Temp:    SpO2: 99% 99%    Last Pain:  Vitals:   04/10/17 1130  TempSrc:   PainSc: Asleep    LLE Motor Response: Purposeful movement (04/10/17 1130) LLE Sensation: Full sensation;No numbness;No tingling (04/10/17 1130) RLE Motor Response: Purposeful movement (04/10/17 1130) RLE Sensation: No numbness;No tingling;Full sensation (04/10/17 1130)      Oliviya Gilkison S

## 2017-04-10 NOTE — Discharge Instructions (Signed)
Walk As Tolerated utilizing back precautions.  No bending, twisting, or lifting.  No driving for 2 weeks.   °Aquacel dressing may remain in place until follow up. May shower with aquacel dressing in place. If the dressing peels off or becomes saturated, you may remove aquacel dressing and place gauze and tape dressing which should be kept clean and dry and changed daily. Do not remove steri-strips if they are present. °See Dr. Kazi Montoro in office in 10 to 14 days. Begin taking aspirin 81mg per day starting 4 days after your surgery if not allergic to aspirin or on another blood thinner. °Walk daily even outside. Use a cane or walker only if necessary. °Avoid sitting on soft sofas. ° °

## 2017-04-11 DIAGNOSIS — M5117 Intervertebral disc disorders with radiculopathy, lumbosacral region: Secondary | ICD-10-CM | POA: Diagnosis not present

## 2017-04-11 NOTE — Discharge Summary (Signed)
Physician Discharge Summary   Patient ID: Keith Austin MRN: [id number] DOB/AGE: [birth year] 46 y.o.  Admit date: 04/10/2017 Discharge date: 04/11/2017  Primary Diagnosis:   HNP, stenosis L5-S1 right  Admission Diagnoses:  Past Medical History:  Diagnosis Date  . Back pain    LUMBAR   . Psoriasis    PRESENTLY EXPERIENCING   . Sciatica   . Sinus bradycardia by electrocardiogram SEE 2013 EOG EPIC   Discharge Diagnoses:   Principal Problem:   HNP (herniated nucleus pulposus), lumbar Active Problems:   Spinal stenosis, lumbar  Procedure:  Procedure(s) (LRB): Microlumbar Decompression L5-S1 right; bilateral foraminotomy (Right)   Consults: None  HPI:  see H&P    Laboratory Data: Hospital Outpatient Visit on 04/05/2017  Component Date Value Ref Range Status  . Sodium 04/05/2017 142  135 - 145 mmol/L Final  . Potassium 04/05/2017 4.5  3.5 - 5.1 mmol/L Final  . Chloride 04/05/2017 107  101 - 111 mmol/L Final  . CO2 04/05/2017 29  22 - 32 mmol/L Final  . Glucose, Bld 04/05/2017 93  65 - 99 mg/dL Final  . BUN 04/05/2017 16  6 - 20 mg/dL Final  . Creatinine, Ser 04/05/2017 1.40* 0.61 - 1.24 mg/dL Final  . Calcium 04/05/2017 9.7  8.9 - 10.3 mg/dL Final  . GFR calc non Af Amer 04/05/2017 58* >60 mL/min Final  . GFR calc Af Amer 04/05/2017 >60  >60 mL/min Final   Comment: (NOTE) The eGFR has been calculated using the CKD EPI equation. This calculation has not been validated in all clinical situations. eGFR's persistently <60 mL/min signify possible Chronic Kidney Disease.   . Anion gap 04/05/2017 6  5 - 15 Final  . WBC 04/05/2017 6.9  4.0 - 10.5 K/uL Final  . RBC 04/05/2017 4.55  4.22 - 5.81 MIL/uL Final  . Hemoglobin 04/05/2017 14.3  13.0 - 17.0 g/dL Final  . HCT 04/05/2017 43.5  39.0 - 52.0 % Final  . MCV 04/05/2017 95.6  78.0 - 100.0 fL Final  . MCH 04/05/2017 31.4  26.0 - 34.0 pg Final  . MCHC 04/05/2017 32.9  30.0 - 36.0 g/dL Final  . RDW 04/05/2017 13.5  11.5  - 15.5 % Final  . Platelets 04/05/2017 228  150 - 400 K/uL Final  . MRSA, PCR 04/05/2017 NEGATIVE  NEGATIVE Final  . Staphylococcus aureus 04/05/2017 POSITIVE* NEGATIVE Final   Comment: (NOTE) The Xpert SA Assay (FDA approved for NASAL specimens in patients 19 years of age and older), is one component of a comprehensive surveillance program. It is not intended to diagnose infection nor to guide or monitor treatment.    No results for input(s): HGB in the last 72 hours. No results for input(s): WBC, RBC, HCT, PLT in the last 72 hours. No results for input(s): NA, K, CL, CO2, BUN, CREATININE, GLUCOSE, CALCIUM in the last 72 hours. No results for input(s): LABPT, INR in the last 72 hours.  X-Rays:Dg Lumbar Spine 2-3 Views  Result Date: 04/05/2017 CLINICAL DATA:  Preoperative evaluation for lumbar surgery. EXAM: LUMBAR SPINE - 2-3 VIEW COMPARISON:  11/26/2016. FINDINGS: Five lumbar type vertebral bodies show normal alignment. Disc space narrowing L5-S1. No fracture or focal lesion. Mild lower lumbar facet arthritis. IMPRESSION: Five lumbar type vertebral bodies numbered for operative correlation. Disc space narrowing L5-S1. Electronically Signed   By: Nelson Chimes M.D.   On: 04/05/2017 15:03   Dg Spine Portable 1 View  Result Date: 04/10/2017 CLINICAL DATA:  Intraoperative localization. EXAM: PORTABLE  SPINE - 1 VIEW COMPARISON:  Earlier today at 8:53 a.m. FINDINGS: Spinal numbering based on preoperative radiograph 04/05/2017. A probe projects over the L5-S1 narrowed disc space, which is labeled on the solitary image. Stable retractor positioning. IMPRESSION: Intraoperative localization at L5-S1. Electronically Signed   By: Monte Fantasia M.D.   On: 04/10/2017 09:57   Dg Spine Portable 1 View  Result Date: 04/10/2017 CLINICAL DATA:  Localization radiograph EXAM: PORTABLE SPINE - 1 VIEW COMPARISON:  Lateral lumbar spine radiograph of earlier today. FINDINGS: The metallic probe projects 2.2 cm  posterior to the posterior margin of the L5-S1 disc space. IMPRESSION: The surgical probe lies 2.2 cm posterior to the L5-S1 disc space. Electronically Signed   By: David  Martinique M.D.   On: 04/10/2017 09:20   Dg Spine Portable 1 View  Result Date: 04/10/2017 CLINICAL DATA:  Intraoperative localization radiograph prior to L5-S1 lumbar decompression on the right. EXAM: PORTABLE SPINE - 1 VIEW COMPARISON:  Lumbar spine series of April 05, 2017 FINDINGS: The upper metallic needle lies approximately 8 mm posterior to the posterior margin of the L4 spinous process. The lower needle projects 2.2 cm from the posterior inferior margin of the L5 spinous process. IMPRESSION: The metallic needles lie posterior to the L4 and L5 spinous processes as described. Electronically Signed   By: David  Martinique M.D.   On: 04/10/2017 09:07    EKG:No orders found for this or any previous visit.   Hospital Course: Patient was admitted to [facility] and taken to the OR and underwent the above state procedure without complications.  Patient tolerated the procedure well and was later transferred to the recovery room and then to the orthopaedic floor for postoperative care.  They were given PO and IV analgesics for pain control following their surgery.  They were given 24 hours of postoperative antibiotics.   PT was consulted postop to assist with mobility and transfers.  The patient was allowed to be WBAT with therapy and was taught back precautions. Discharge planning was consulted to help with postop disposition and equipment needs.  Patient had a good night on the evening of surgery and started to get up OOB with therapy on day one. Patient was seen in rounds and was ready to go home on day one.  They were given discharge instructions and dressing directions.  They were instructed on when to follow up in the office with Dr. Tonita Cong.   Diet: Regular diet Activity:WBAT; Lspine precautions Follow-up:in 10-14  days Disposition - Home Discharged Condition: good   Discharge Instructions    Call MD / Call 911   Complete by:  As directed    If you experience chest pain or shortness of breath, CALL 911 and be transported to the hospital emergency room.  If you develope a fever above 101 F, pus (white drainage) or increased drainage or redness at the wound, or calf pain, call your surgeon's office.   Constipation Prevention   Complete by:  As directed    Drink plenty of fluids.  Prune juice may be helpful.  You may use a stool softener, such as Colace (over the counter) 100 mg twice a day.  Use MiraLax (over the counter) for constipation as needed.   Diet - low sodium heart healthy   Complete by:  As directed    Increase activity slowly as tolerated   Complete by:  As directed      Allergies as of 04/11/2017   No Known Allergies  Medication List    STOP taking these medications   ibuprofen 200 MG tablet Commonly known as:  ADVIL,MOTRIN     TAKE these medications   docusate sodium 100 MG capsule Commonly known as:  COLACE Take 1 capsule (100 mg total) by mouth 2 (two) times daily as needed for mild constipation.   methocarbamol 500 MG tablet Commonly known as:  ROBAXIN Take 1 tablet (500 mg total) by mouth every 6 (six) hours as needed for muscle spasms.   oxyCODONE-acetaminophen 5-325 MG tablet Commonly known as:  PERCOCET/ROXICET Take 1-2 tablets by mouth every 4 (four) hours as needed for moderate pain.      Follow-up Information    Susa Day, MD Follow up in 2 week(s).   Specialty:  Orthopedic Surgery Contact information: [address] [place] [address] [phone]           Signed: Lacie Draft, PA-C Orthopaedic Surgery 04/11/2017, 11:05 AM

## 2017-04-11 NOTE — Op Note (Signed)
NAME:  Keith Austin, Keith Austin                ACCOUNT NO.:  MEDICAL RECORD NO.:  [id number]  LOCATION:                                 FACILITY:  PHYSICIAN:  Jene EveryJeffrey Daine Gunther, M.D.    DATE OF BIRTH:  [birth year]  DATE OF PROCEDURE:  04/10/2017 DATE OF DISCHARGE:                              OPERATIVE REPORT   PREOPERATIVE DIAGNOSIS:  Spinal stenosis, herniated nucleus pulposus, L5- S1, right.  POSTOPERATIVE DIAGNOSIS:  Spinal stenosis, herniated nucleus pulposus, L5-S1, right.  PROCEDURES PERFORMED: 1. Microlumbar decompression, L5-S1, right. 2. Foraminotomies, L5-S1, right. 3. Microdiskectomy, L5-S1, right.  ANESTHESIA:  General.  ASSISTANT:  Andrez GrimeJaclyn Bissell, PA.  HISTORY:  A 46 year old with L5-S1 radiculopathy, neural tension signs secondary to disk herniation, stenosis both foraminally and lateral recess.  He was indicated for microlumbar decompression, L5-S1.  Risks and benefits discussed including bleeding, infection, damage to neurovascular structures, no changes in symptoms, worsening symptoms, DVT, PE, anesthetic complications, etc.  TECHNIQUE:  With the patient in supine position after induction of adequate general anesthesia, 2 g of Kefzol and 900 of clindamycin, the lumbar region was prepped and draped in usual sterile fashion, prone on the [place] frame.  All bony prominences were well padded.  Lumbar region was prepped and draped in usual sterile fashion.  Two 18-gauge spinal needles were utilized to localize the L5-S1 interspace confirmed with x-ray.  Incision was made from the spinous process at L5-S1. Subcutaneous tissue was dissected.  Electrocautery was utilized to achieve hemostasis.  Dorsolumbar fascia was divided in line with the skin incision.  Paraspinous muscle elevated from lamina of L5 and S1 after infiltration with Marcaine with epinephrine.  McCullough retractor was placed.  Operating microscope draped and brought on the surgical field.  A microcurette  was utilized to detach the ligamentum flavum from the cephalad edge of S1.  Hemilaminotomies of the caudad edge of L5 were performed to detach the ligamentum flavum, preserving the pars.  Neural patty to protect the neural elements.  The S1 nerve root was identified and gently mobilized medially.  There was hypertrophic epidural adipose tissue.  The S1 nerve was identified and gently mobilized medially.  A foraminotomy of S1 was performed.  There was multiple tethering vascular attachments to the S1 nerve root, they were cauterized and divided.  The focal HNP was noted at L5-S1.  I performed an annulotomy.  Copious portion of disk material was removed from the disk space with straight and up-biting pituitary from both sides of the operating room table, further mobilized with a Woodson and antibiotic irrigation lavage and subsequent retrieval of fragments.  Next, the foramen of L5 was found to be stenotic and a foraminotomy was performed with a 2-mm Kerrison protecting the L5 nerve root.  We undercut the superior articulating process of S1 and enlarging the foraminotomy of 5.  Following this, the neural probe passed freely at the foramen of 5, above the pedicle of 5 and below the pedicle of S1.  There was 1 cm of excursion of the S1 nerve root medial to pedicle without tension.  Copiously irrigated.  No evidence of CSF leakage or active bleeding.  Confirmatory radiograph obtained.  McCulloch retractor  was removed.  No evidence of CSF leakage or active bleeding.  Dorsolumbar fascia was reapproximated with 1 Vicryl, copious irrigation subcu with 2-0 and skin with Prolene. Sterile dressing applied.  Placed supine on the hospital bed, extubated without difficulty and transported to the recovery room in satisfactory condition.  The patient tolerated the procedure well.  No complications.  Assistant, Andrez GrimeJaclyn Bissell, [place].  Minimal blood loss.     Jene EveryJeffrey Yakir Wenke, M.D.     Cordelia PenJB/MEDQ  D:   04/10/2017  T:  04/11/2017  Job:  [id number]

## 2017-04-11 NOTE — Evaluation (Signed)
Physical Therapy Evaluation Patient Details Name: Keith Austin MRN: [id number] DOB: 12/25/1970 Today's Date: 04/11/2017   History of Present Illness  s/p L5-S1 decompression  Clinical Impression  Pt s/p back surgery and presents with functional mobility limitations 2* post op pain and back precautions.  Pt is currently mobilizing at sup- IND assist level and plans dc home with family assist.    Follow Up Recommendations No PT follow up    Equipment Recommendations  None recommended by PT    Recommendations for Other Services       Precautions / Restrictions Precautions Precautions: Back;Fall Restrictions Weight Bearing Restrictions: No      Mobility  Bed Mobility Overal bed mobility: Needs Assistance Bed Mobility: Supine to Sit;Sit to Supine     Supine to sit: Supervision Sit to supine: Supervision   General bed mobility comments: supervision for log roll and sidelying to sit  Transfers Overall transfer level: Needs assistance Equipment used: Rolling walker (2 wheeled);None Transfers: Sit to/from Stand Sit to Stand: Supervision         General transfer comment: for safety; cues for back precautions and hand placement  Ambulation/Gait Ambulation/Gait assistance: Min guard;Supervision Ambulation Distance (Feet): 450 Feet Assistive device: Rolling walker (2 wheeled);None Gait Pattern/deviations: Step-through pattern;Decreased step length - right;Decreased step length - left;Shuffle Gait velocity: decr Gait velocity interpretation: Below normal speed for age/gender General Gait Details: Pt ambulated 225 with RW and 225 sans AD - good stability and no loss of balance  Stairs            Wheelchair Mobility    Modified Rankin (Stroke Patients Only)       Balance Overall balance assessment: No apparent balance deficits (not formally assessed)                                           Pertinent Vitals/Pain Pain Assessment:  0-10 Pain Score: 4  Pain Location: back Pain Descriptors / Indicators: Sore Pain Intervention(s): Limited activity within patient's tolerance;Monitored during session;Premedicated before session    Home Living Family/patient expects to be discharged to:: Private residence Living Arrangements: Spouse/significant other Available Help at Discharge: Family Type of Home: House Home Access: Level entry     Home Layout: One level Home Equipment: Bedside commode;Shower seat;Walker - 2 wheels Additional Comments: has DME from mother in law    Prior Function Level of Independence: Independent               Hand Dominance        Extremity/Trunk Assessment   Upper Extremity Assessment Upper Extremity Assessment: Overall WFL for tasks assessed    Lower Extremity Assessment Lower Extremity Assessment: Overall WFL for tasks assessed       Communication   Communication: No difficulties  Cognition Arousal/Alertness: Awake/alert Behavior During Therapy: WFL for tasks assessed/performed Overall Cognitive Status: Within Functional Limits for tasks assessed                                        General Comments      Exercises     Assessment/Plan    PT Assessment Patient needs continued PT services  PT Problem List         PT Treatment Interventions DME instruction;Gait training;Functional mobility training;Therapeutic activities;Patient/family education  PT Goals (Current goals can be found in the Care Plan section)  Acute Rehab PT Goals Patient Stated Goal: decreased pain and return to independence PT Goal Formulation: With patient Time For Goal Achievement: 04/11/17 Potential to Achieve Goals: Good    Frequency Min 1X/week   Barriers to discharge        Co-evaluation               AM-PAC PT "6 Clicks" Daily Activity  Outcome Measure Difficulty turning over in bed (including adjusting bedclothes, sheets and blankets)?: A  Little Difficulty moving from lying on back to sitting on the side of the bed? : A Little Difficulty sitting down on and standing up from a chair with arms (e.g., wheelchair, bedside commode, etc,.)?: A Little Help needed moving to and from a bed to chair (including a wheelchair)?: A Little Help needed walking in hospital room?: None Help needed climbing 3-5 steps with a railing? : A Little 6 Click Score: 19    End of Session   Activity Tolerance: Patient tolerated treatment well Patient left: in bed;with call bell/phone within reach;with family/visitor present Nurse Communication: Mobility status PT Visit Diagnosis: Difficulty in walking, not elsewhere classified (R26.2);Pain Pain - Right/Left: (back) Pain - part of body: (back)    Time: [id number] PT Time Calculation (min) (ACUTE ONLY): 22 min   Charges:   PT Evaluation $PT Eval Low Complexity: 1 Low     PT G Codes:   PT G-Codes **NOT FOR INPATIENT CLASS** Functional Assessment Tool Used: Clinical judgement Functional Limitation: Mobility: Walking and moving around Mobility: Walking and Moving Around Current Status ([id number]): At least 1 percent but less than 20 percent impaired, limited or restricted Mobility: Walking and Moving Around Goal Status [phone](G8979): At least 1 percent but less than 20 percent impaired, limited or restricted Mobility: Walking and Moving Around Discharge Status [phone](G8980): At least 1 percent but less than 20 percent impaired, limited or restricted    Pg [phone]   [name] 04/11/2017, 12:42 PM

## 2017-04-11 NOTE — Progress Notes (Signed)
Patient discharged to home with family. Given all belongings, instructions, prescriptions. Wife present for all teaching, both verbalized understanding of instructions. Escorted to pov via w/c. 

## 2017-04-11 NOTE — Evaluation (Signed)
Occupational Therapy Evaluation Patient Details Name: Keith CopaSteven Czaja MRN: [id number] DOB: 09/06/1970 Today's Date: 04/11/2017    History of Present Illness s/p L5-S1 decompression   Clinical Impression   This 46 year old man was admitted for the above sx. All education was completed. No further OT is needed at this time     Follow Up Recommendations  Supervision - Intermittent    Equipment Recommendations  None recommended by OT    Recommendations for Other Services       Precautions / Restrictions Precautions Precautions: Back;Fall Restrictions Weight Bearing Restrictions: No      Mobility Bed Mobility Overal bed mobility: Needs Assistance             General bed mobility comments: supervision for log roll and sidelying to sit  Transfers Overall transfer level: Needs assistance Equipment used: Rolling walker (2 wheeled) Transfers: Sit to/from Stand Sit to Stand: Min guard         General transfer comment: for safety; cues for back precautions and hand placement    Balance                                           ADL either performed or assessed with clinical judgement   ADL Overall ADL's : Needs assistance/impaired Eating/Feeding: Independent   Grooming: Oral care;Supervision/safety;Standing   Upper Body Bathing: Set up;Sitting   Lower Body Bathing: Moderate assistance;Sit to/from stand   Upper Body Dressing : Set up;Sitting   Lower Body Dressing: Maximal assistance;Sit to/from stand   Toilet Transfer: Min guard;Ambulation;BSC   Toileting- ArchitectClothing Manipulation and Hygiene: Min guard;Sit to/from stand         General ADL Comments: educated on back precautions and adls; handout given.  Demonstrated sidestepping over tub when ready. He has a seat in the tub. Educated on toilet aide options, if needed     Vision         Perception     Praxis      Pertinent Vitals/Pain Pain Assessment: 0-10 Pain Score: 4   Pain Location: back Pain Descriptors / Indicators: Sore Pain Intervention(s): Limited activity within patient's tolerance;Monitored during session;Premedicated before session;Repositioned     Hand Dominance     Extremity/Trunk Assessment Upper Extremity Assessment Upper Extremity Assessment: Overall WFL for tasks assessed           Communication Communication Communication: No difficulties   Cognition Arousal/Alertness: Awake/alert Behavior During Therapy: WFL for tasks assessed/performed Overall Cognitive Status: Within Functional Limits for tasks assessed                                     General Comments       Exercises     Shoulder Instructions      Home Living Family/patient expects to be discharged to:: Private residence Living Arrangements: Spouse/significant other Available Help at Discharge: Family               Bathroom Shower/Tub: Chief Strategy OfficerTub/shower unit   Bathroom Toilet: Standard     Home Equipment: Bedside commode;Shower seat   Additional Comments: has DME from mother in law      Prior Functioning/Environment Level of Independence: Independent                 OT Problem List:  OT Treatment/Interventions:      OT Goals(Current goals can be found in the care plan section) Acute Rehab OT Goals Patient Stated Goal: decreased pain and return to independence OT Goal Formulation: All assessment and education complete, DC therapy  OT Frequency:     Barriers to D/C:            Co-evaluation              AM-PAC PT "6 Clicks" Daily Activity     Outcome Measure Help from another person eating meals?: None Help from another person taking care of personal grooming?: A Little Help from another person toileting, which includes using toliet, bedpan, or urinal?: A Little Help from another person bathing (including washing, rinsing, drying)?: A Little Help from another person to put on and taking off regular upper  body clothing?: A Little Help from another person to put on and taking off regular lower body clothing?: A Lot 6 Click Score: 18   End of Session    Activity Tolerance: Patient tolerated treatment well Patient left: in chair;with call bell/phone within reach  OT Visit Diagnosis: Muscle weakness (generalized) (M62.81)                Time: [id number] OT Time Calculation (min): 30 min Charges:  OT General Charges $OT Visit: 1 Visit OT Evaluation $OT Eval Low Complexity: 1 Low OT Treatments $Self Care/Home Management : 8-22 mins G-Codes: OT G-codes **NOT FOR INPATIENT CLASS** Functional Assessment Tool Used: Clinical judgement Functional Limitation: Self care Self Care Current Status (W1191(G8987): At least 60 percent but less than 80 percent impaired, limited or restricted Self Care Goal Status (Y7829(G8988): At least 60 percent but less than 80 percent impaired, limited or restricted Self Care Discharge Status [phone]([id number]): At least 60 percent but less than 80 percent impaired, limited or restricted   Marica OtterMaryellen Kihanna Kamiya, OTR/L [phone] 04/11/2017  Raihan Kimmel 04/11/2017, 10:25 AM

## 2017-04-11 NOTE — Progress Notes (Signed)
Subjective: 1 Day Post-Op Procedure(s) (LRB): Microlumbar Decompression L5-S1 right; bilateral foraminotomy (Right) Patient reports pain as 3 on 0-10 scale.    Objective: Vital signs in last 24 hours: Temp:  [97 F (36.1 C)-98.7 F (37.1 C)] 97.6 F (36.4 C) (12/20 0519) Pulse Rate:  [62-103] 62 (12/20 0519) Resp:  [12-20] 14 (12/20 0519) BP: (104-149)/(70-103) 113/70 (12/20 0519) SpO2:  [97 %-100 %] 97 % (12/20 0519)  Intake/Output from previous day: 12/19 0701 - 12/20 0700 In: 3022.5 [P.O.:800; I.V.:1867.5; IV Piggyback:355] Out: 1825 [Urine:1800; Blood:25] Intake/Output this shift: No intake/output data recorded.  No results for input(s): HGB in the last 72 hours. No results for input(s): WBC, RBC, HCT, PLT in the last 72 hours. No results for input(s): NA, K, CL, CO2, BUN, CREATININE, GLUCOSE, CALCIUM in the last 72 hours. No results for input(s): LABPT, INR in the last 72 hours.  Neurologically intact Sensation intact distally Dorsiflexion/Plantar flexion intact Incision: dressing C/D/I No DVT Assessment/Plan: 1 Day Post-Op Procedure(s) (LRB): Microlumbar Decompression L5-S1 right; bilateral foraminotomy (Right) Advance diet Up with therapy D/C IV fluids Discharge home with home health Instr given. Discussed OP  Ladarius Seubert C 04/11/2017, 7:11 AM

## 2017-04-19 ENCOUNTER — Encounter (HOSPITAL_COMMUNITY): Payer: Self-pay | Admitting: Nurse Practitioner

## 2017-04-19 ENCOUNTER — Emergency Department (HOSPITAL_COMMUNITY): Payer: Self-pay

## 2017-04-19 ENCOUNTER — Inpatient Hospital Stay (HOSPITAL_COMMUNITY)
Admission: EM | Admit: 2017-04-19 | Discharge: 2017-04-22 | DRG: 176 | Disposition: A | Discharge status: Home / Self Care | Payer: Self-pay | Attending: Internal Medicine | Admitting: Internal Medicine

## 2017-04-19 ENCOUNTER — Other Ambulatory Visit: Payer: Self-pay

## 2017-04-19 DIAGNOSIS — Z7982 Long term (current) use of aspirin: Secondary | ICD-10-CM

## 2017-04-19 DIAGNOSIS — I2699 Other pulmonary embolism without acute cor pulmonale: Principal | ICD-10-CM | POA: Diagnosis present

## 2017-04-19 DIAGNOSIS — Z9889 Other specified postprocedural states: Secondary | ICD-10-CM

## 2017-04-19 DIAGNOSIS — N183 Chronic kidney disease, stage 3 (moderate): Secondary | ICD-10-CM | POA: Diagnosis present

## 2017-04-19 DIAGNOSIS — I82401 Acute embolism and thrombosis of unspecified deep veins of right lower extremity: Secondary | ICD-10-CM | POA: Diagnosis present

## 2017-04-19 DIAGNOSIS — Z79899 Other long term (current) drug therapy: Secondary | ICD-10-CM

## 2017-04-19 LAB — BASIC METABOLIC PANEL
Anion gap: 7 (ref 5–15)
BUN: 11 mg/dL (ref 6–20)
CHLORIDE: 99 mmol/L — AB (ref 101–111)
CO2: 27 mmol/L (ref 22–32)
CREATININE: 1.41 mg/dL — AB (ref 0.61–1.24)
Calcium: 8.7 mg/dL — ABNORMAL LOW (ref 8.9–10.3)
GFR calc Af Amer: >60 mL/min (ref >60)
GFR calc non Af Amer: 58 mL/min — ABNORMAL LOW (ref >60)
Glucose, Bld: 126 mg/dL — ABNORMAL HIGH (ref 65–99)
POTASSIUM: 4.5 mmol/L (ref 3.5–5.1)
Sodium: 133 mmol/L — ABNORMAL LOW (ref 135–145)

## 2017-04-19 LAB — CBC
HEMATOCRIT: 40.7 % (ref 39.0–52.0)
Hemoglobin: 13.5 g/dL (ref 13.0–17.0)
MCH: 31.3 pg (ref 26.0–34.0)
MCHC: 33.2 g/dL (ref 30.0–36.0)
MCV: 94.4 fL (ref 78.0–100.0)
PLATELETS: 246 10*3/uL (ref 150–400)
RBC: 4.31 MIL/uL (ref 4.22–5.81)
RDW: 12.9 % (ref 11.5–15.5)
WBC: 13.7 10*3/uL — ABNORMAL HIGH (ref 4.0–10.5)

## 2017-04-19 LAB — I-STAT TROPONIN, ED: Troponin i, poc: 0 ng/mL (ref 0.00–0.08)

## 2017-04-19 MED ORDER — IOPAMIDOL (ISOVUE-370) INJECTION 76%
100.0000 mL | Freq: Once | INTRAVENOUS | Status: AC | PRN
  Administered 2017-04-20: 100 mL via INTRAVENOUS

## 2017-04-19 MED ORDER — SODIUM CHLORIDE 0.9 % IV SOLN
INTRAVENOUS | Status: DC
  Administered 2017-04-20 – 2017-04-21 (×4): via INTRAVENOUS

## 2017-04-19 MED ORDER — METHOCARBAMOL 1000 MG/10ML IJ SOLN: 1000.0000 mg | Freq: Once | INTRAMUSCULAR | Status: DC

## 2017-04-19 MED ORDER — FENTANYL CITRATE (PF) 100 MCG/2ML IJ SOLN
50.0000 ug | Freq: Once | INTRAMUSCULAR | Status: AC
  Administered 2017-04-20: 50 ug via INTRAVENOUS
  Filled 2017-04-19: qty 2

## 2017-04-19 MED ORDER — METHOCARBAMOL 1000 MG/10ML IJ SOLN
1000.0000 mg | Freq: Once | INTRAVENOUS | Status: AC
  Administered 2017-04-20: 1000 mg via INTRAVENOUS
  Filled 2017-04-19: qty 10

## 2017-04-19 NOTE — ED Triage Notes (Signed)
Pt is c/o a sudden onset of diffuse chest pain and shortness of breath. Pt also reports recent back surgery, and denies being on any anticoagulant.

## 2017-04-19 NOTE — ED Notes (Signed)
Pt had disc surgery last Wednesday and is c/o SOB and pain under the ribs that radiates to the mid right back.

## 2017-04-20 ENCOUNTER — Observation Stay (HOSPITAL_BASED_OUTPATIENT_CLINIC_OR_DEPARTMENT_OTHER): Payer: Self-pay

## 2017-04-20 ENCOUNTER — Encounter (HOSPITAL_COMMUNITY): Payer: Self-pay | Admitting: Emergency Medicine

## 2017-04-20 DIAGNOSIS — Z9889 Other specified postprocedural states: Secondary | ICD-10-CM

## 2017-04-20 DIAGNOSIS — I2699 Other pulmonary embolism without acute cor pulmonale: Secondary | ICD-10-CM

## 2017-04-20 LAB — ECHOCARDIOGRAM COMPLETE
Ao-asc: 31.2 cm
FS: 30 % (ref 28–44)
HEIGHTINCHES: 72 in
IV/PV OW: 1.06
LA ID, A-P, ES: 37.9 mm
LA diam index: 1.57 cm/m2
LEFT ATRIUM END SYS DIAM: 37.9 mm
LVOT area: 5.04 cm2
LVOT diameter: 25.3 mm
MV Peak grad: 2 mmHg
MV pk E vel: 75.6 m/s
MVPKAVEL: 85.8 m/s
PW: 9.8 mm — AB (ref 0.6–1.1)
Weight: 3961.23 oz

## 2017-04-20 LAB — HEPARIN LEVEL (UNFRACTIONATED)
HEPARIN UNFRACTIONATED: 0.63 [IU]/mL (ref 0.30–0.70)
Heparin Unfractionated: 0.62 IU/mL (ref 0.30–0.70)

## 2017-04-20 LAB — CBC
HEMATOCRIT: 38.8 % — AB (ref 39.0–52.0)
Hemoglobin: 12.6 g/dL — ABNORMAL LOW (ref 13.0–17.0)
MCH: 31 pg (ref 26.0–34.0)
MCHC: 32.5 g/dL (ref 30.0–36.0)
MCV: 95.3 fL (ref 78.0–100.0)
Platelets: 236 10*3/uL (ref 150–400)
RBC: 4.07 MIL/uL — ABNORMAL LOW (ref 4.22–5.81)
RDW: 13.2 % (ref 11.5–15.5)
WBC: 14.4 10*3/uL — ABNORMAL HIGH (ref 4.0–10.5)

## 2017-04-20 LAB — HIV ANTIBODY (ROUTINE TESTING W REFLEX): HIV SCREEN 4TH GENERATION: NONREACTIVE

## 2017-04-20 LAB — I-STAT TROPONIN, ED: Troponin i, poc: 0 ng/mL (ref 0.00–0.08)

## 2017-04-20 LAB — PROTIME-INR
INR: 1
Prothrombin Time: 13.1 seconds (ref 11.4–15.2)

## 2017-04-20 LAB — APTT: APTT: 34 s (ref 24–36)

## 2017-04-20 MED ORDER — DOCUSATE SODIUM 100 MG PO CAPS
100.0000 mg | ORAL_CAPSULE | Freq: Two times a day (BID) | ORAL | Status: DC | PRN
  Administered 2017-04-21: 100 mg via ORAL
  Filled 2017-04-20: qty 1

## 2017-04-20 MED ORDER — METHOCARBAMOL 500 MG PO TABS
500.0000 mg | ORAL_TABLET | Freq: Four times a day (QID) | ORAL | Status: DC | PRN
  Administered 2017-04-20 (×2): 500 mg via ORAL
  Filled 2017-04-20 (×2): qty 1

## 2017-04-20 MED ORDER — OXYCODONE-ACETAMINOPHEN 5-325 MG PO TABS
1.0000 | ORAL_TABLET | ORAL | Status: DC | PRN
  Administered 2017-04-20 – 2017-04-22 (×11): 2 via ORAL
  Administered 2017-04-22: 1 via ORAL
  Filled 2017-04-20 (×6): qty 2
  Filled 2017-04-20: qty 1
  Filled 2017-04-20 (×5): qty 2

## 2017-04-20 MED ORDER — ACETAMINOPHEN 325 MG PO TABS: 650.0000 mg | ORAL_TABLET | Freq: Four times a day (QID) | ORAL | Status: DC | PRN

## 2017-04-20 MED ORDER — HEPARIN BOLUS VIA INFUSION
5000.0000 [IU] | Freq: Once | INTRAVENOUS | Status: AC
  Administered 2017-04-20: 5000 [IU] via INTRAVENOUS
  Filled 2017-04-20: qty 5000

## 2017-04-20 MED ORDER — ONDANSETRON HCL 4 MG PO TABS: 4.0000 mg | ORAL_TABLET | Freq: Four times a day (QID) | ORAL | Status: DC | PRN

## 2017-04-20 MED ORDER — ONDANSETRON HCL 4 MG/2ML IJ SOLN: 4.0000 mg | Freq: Four times a day (QID) | INTRAMUSCULAR | Status: DC | PRN

## 2017-04-20 MED ORDER — HEPARIN (PORCINE) IN NACL 100-0.45 UNIT/ML-% IJ SOLN
1800.0000 [IU]/h | INTRAMUSCULAR | Status: DC
  Administered 2017-04-20 – 2017-04-21 (×3): 1800 [IU]/h via INTRAVENOUS
  Filled 2017-04-20 (×3): qty 250

## 2017-04-20 MED ORDER — FENTANYL CITRATE (PF) 100 MCG/2ML IJ SOLN
100.0000 ug | Freq: Once | INTRAMUSCULAR | Status: AC
  Administered 2017-04-20: 100 ug via INTRAVENOUS
  Filled 2017-04-20: qty 2

## 2017-04-20 MED ORDER — ASPIRIN 81 MG PO CHEW: 81.0000 mg | CHEWABLE_TABLET | Freq: Every day | ORAL | Status: DC

## 2017-04-20 MED ORDER — ACETAMINOPHEN 650 MG RE SUPP: 650.0000 mg | Freq: Four times a day (QID) | RECTAL | Status: DC | PRN

## 2017-04-20 MED ORDER — OXYCODONE-ACETAMINOPHEN 5-325 MG PO TABS: 1.0000 | ORAL_TABLET | Freq: Four times a day (QID) | ORAL | Status: DC | PRN

## 2017-04-20 MED ORDER — IOPAMIDOL (ISOVUE-370) INJECTION 76%
INTRAVENOUS | Status: AC
  Filled 2017-04-20: qty 100

## 2017-04-20 MED ORDER — SODIUM CHLORIDE 0.9 % IJ SOLN
INTRAMUSCULAR | Status: AC
  Filled 2017-04-20: qty 50

## 2017-04-20 MED ORDER — HYDROMORPHONE HCL 1 MG/ML IJ SOLN
1.0000 mg | Freq: Four times a day (QID) | INTRAMUSCULAR | Status: DC | PRN
  Administered 2017-04-20 (×3): 1 mg via INTRAVENOUS
  Filled 2017-04-20 (×3): qty 1

## 2017-04-20 NOTE — Progress Notes (Signed)
ANTICOAGULATION CONSULT NOTE - Initial Consult  Pharmacy Consult for IV heparin Indication: pulmonary embolus  No Known Allergies  Patient Measurements: Height: 6' (182.9 cm) Weight: 245 lb (111.1 kg) IBW/kg (Calculated) : 77.6 Heparin Dosing Weight: 87 kg  Vital Signs: Temp: 99 F (37.2 C) (12/29 0015) Temp Source: Oral (12/29 0015) BP: 126/87 (12/29 0126) Pulse Rate: 89 (12/29 0126)  Labs: Recent Labs    04/19/17 2159  HGB 13.5  HCT 40.7  PLT 246  CREATININE 1.41*    Estimated Creatinine Clearance: 82.5 mL/min (A) (by C-G formula based on SCr of 1.41 mg/dL (H)).   Medical History: Past Medical History:  Diagnosis Date  . Back pain    LUMBAR   . Psoriasis    PRESENTLY EXPERIENCING   . Sciatica   . Sinus bradycardia by electrocardiogram SEE 2013 EOG EPIC    Medications:  Scheduled:  . heparin  5,000 Units Intravenous Once  . iopamidol      . sodium chloride       Infusions:  . sodium chloride 125 mL/hr at 04/20/17 0006  . heparin      Assessment: 46 yoM c/o SOB. Bilateral PE with right heart strain on CT. IV heparin for PE.  Baseline coags pending No anticoagulants PTA Goal of Therapy:  Heparin level 0.3-0.7 units/ml Monitor platelets by anticoagulation protocol: Yes   Plan:  Baseline coags STAT Heparin 5000 unit bolus x1 now Start drip at 1800 units/hr Daily CBC/HL Check 1st HL in 6 hours  Keith Austin, Keith Austin 04/20/2017,1:57 AM

## 2017-04-20 NOTE — Progress Notes (Signed)
Pt admitted after midnight. For details, please refer to admission note done 04/20/2017. Pt admitted for PE. Continue current AC. LE doppler showed DVT in RLE.  Manson Passeylma Euva Rundell [facility] [phone]

## 2017-04-20 NOTE — Progress Notes (Signed)
ANTICOAGULATION CONSULT NOTE - Follow - Up  Pharmacy Consult for IV heparin Indication: pulmonary embolus  No Known Allergies  Patient Measurements: Height: 6' (182.9 cm) Weight: 247 lb 9.2 oz (112.3 kg) IBW/kg (Calculated) : 77.6 Heparin Dosing Weight: 87 kg  Vital Signs: Temp: 98.2 F (36.8 C) (12/29 0350) Temp Source: Oral (12/29 0350) BP: 133/87 (12/29 0350) Pulse Rate: 78 (12/29 0350)  Labs: Recent Labs    04/19/17 2159 04/20/17 0752  HGB 13.5 12.6*  HCT 40.7 38.8*  PLT 246 236  APTT 34  --   LABPROT 13.1  --   INR 1.00  --   HEPARINUNFRC  --  0.63  CREATININE 1.41*  --     Estimated Creatinine Clearance: 82.9 mL/min (A) (by C-G formula based on SCr of 1.41 mg/dL (H)).   Medical History: Past Medical History:  Diagnosis Date  . Back pain    LUMBAR   . Psoriasis    PRESENTLY EXPERIENCING   . Sciatica   . Sinus bradycardia by electrocardiogram SEE 2013 EOG EPIC    Medications:  Scheduled:  . iopamidol      . sodium chloride       Infusions:  . sodium chloride 125 mL/hr at 04/20/17 0912  . heparin 1,800 Units/hr (04/20/17 0202)    Assessment: 46 yoM c/o SOB. Bilateral PE with right heart strain on CT. IV heparin for PE.  No anticoagulants PTA  Baseline Labs  PT 13,1 INR 1.00  Hgb 13.5, plt 246  SCr 1.41, CrCl , 83 ml/min   Today,04/20/17   Hgb 12.6, plt 236, WNL  First HL is 0.63, therapeutic  No bleeding or line issues per RN     Goal of Therapy:  Heparin level 0.3-0.7 units/ml Monitor platelets by anticoagulation protocol: Yes   Plan:   Continue heparin drip at 1800 units/hr  Confirmatory HL 6 hours after initial check   Daily CBC/HL  Monitor for signs and symptoms of bleeding   Adalberto ColeNikola Phuong Moffatt, PharmD, BCPS Pager [phone] 04/20/2017 9:22 AM

## 2017-04-20 NOTE — H&P (Signed)
History and Physical    Keith CopaSteven Austin UJW:[id number] DOB: 07/31/1970 DOA: 04/19/2017  PCP: Patient, No Pcp Per  Patient coming from: Home  I [facility] personally briefly [facility]  Chief Complaint: Chest pain, SOB  HPI: Keith CopaSteven Borgwardt is a 46 y.o. male with medical history significant of lumbar laminectomy performed 10 days ago.  Patient presents to the ED with c/o 2 day history of chest pain and SOB.  Pain is pleuritic, severe, 10/10, sharp and occurs with deep breaths.  Started on left then moved to R side of lower chest.   ED Course: CT chest demonstrates B PEs.   Review of Systems: As per HPI otherwise 10 point review of systems negative.   Past Medical History:  Diagnosis Date  . Back pain    LUMBAR   . Psoriasis    PRESENTLY EXPERIENCING   . Sciatica   . Sinus bradycardia by electrocardiogram SEE 2013 EOG EPIC    Past Surgical History:  Procedure Laterality Date  . LUMBAR LAMINECTOMY/DECOMPRESSION MICRODISCECTOMY Right 04/10/2017   Procedure: Microlumbar Decompression L5-S1 right; bilateral foraminotomy;  Surgeon: Jene EveryBeane, Jeffrey, MD;  Location: [facility];  Service: Orthopedics;  Laterality: Right;  90 mins  . SHOULDER SURGERY Left 5-6- YEARS AGO   "SHAVE AND A TORN BICEP "      reports that  has never smoked. he has never used smokeless tobacco. He reports that he does not drink alcohol. His drug history is not on file.  No Known Allergies  History reviewed. No pertinent family history.   Prior to Admission medications   Medication Sig Start Date End Date Taking? Authorizing Provider  aspirin 81 MG chewable tablet Chew 81 mg by mouth daily.   Yes [provider]  docusate sodium (COLACE) 100 MG capsule Take 1 capsule (100 mg total) by mouth 2 (two) times daily as needed for mild constipation. 04/10/17  Yes Jene EveryBeane, Jeffrey, MD  methocarbamol (ROBAXIN) 500 MG tablet Take 1 tablet (500 mg total) by mouth every  6 (six) hours as needed for muscle spasms. 04/10/17  Yes Jene EveryBeane, Jeffrey, MD  oxyCODONE-acetaminophen (PERCOCET/ROXICET) 5-325 MG tablet Take 1-2 tablets by mouth every 4 (four) hours as needed for moderate pain. 04/10/17  Yes Jene EveryBeane, Jeffrey, MD    Physical Exam: Vitals:   04/20/17 0015 04/20/17 0126 04/20/17 0200 04/20/17 0230  BP: 130/83 126/87 125/85 121/84  Pulse: 88 89 91 85  Resp: (!) 21 20 (!) 23 (!) 28  Temp: 99 F (37.2 C)     TempSrc: Oral     SpO2: 93% 96% 100% 100%  Weight:      Height:        Constitutional: NAD, calm, comfortable Eyes: PERRL, lids and conjunctivae normal ENMT: Mucous membranes are moist. Posterior pharynx clear of any exudate or lesions.Normal dentition.  Neck: normal, supple, no masses, no thyromegaly Respiratory: clear to auscultation bilaterally, no wheezing, no crackles. Normal respiratory effort. No accessory muscle use.  Cardiovascular: Regular rate and rhythm, no murmurs / rubs / gallops. No extremity edema. 2+ pedal pulses. No carotid bruits.  Abdomen: no tenderness, no masses palpated. No hepatosplenomegaly. Bowel sounds positive.  Musculoskeletal: no clubbing / cyanosis. No joint deformity upper and lower extremities. Good ROM, no contractures. Normal muscle tone.  Skin: no rashes, lesions, ulcers. No induration Neurologic: CN 2-12 grossly intact. Sensation intact, DTR normal. Strength 5/5 in all 4.  Psychiatric: Normal judgment and insight. Alert and oriented x 3. Normal  mood.    Labs on Admission: I have personally reviewed following labs and imaging studies  CBC: Recent Labs  Lab 04/19/17 2159  WBC 13.7*  HGB 13.5  HCT 40.7  MCV 94.4  PLT 246   Basic Metabolic Panel: Recent Labs  Lab 04/19/17 2159  NA 133*  K 4.5  CL 99*  CO2 27  GLUCOSE 126*  BUN 11  CREATININE 1.41*  CALCIUM 8.7*   GFR: Estimated Creatinine Clearance: 82.5 mL/min (A) (by C-G formula based on SCr of 1.41 mg/dL (H)). Liver Function Tests: No results  for input(s): AST, ALT, ALKPHOS, BILITOT, PROT, ALBUMIN in the last 168 hours. No results for input(s): LIPASE, AMYLASE in the last 168 hours. No results for input(s): AMMONIA in the last 168 hours. Coagulation Profile: Recent Labs  Lab 04/19/17 2159  INR 1.00   Cardiac Enzymes: No results for input(s): CKTOTAL, CKMB, CKMBINDEX, TROPONINI in the last 168 hours. BNP (last 3 results) No results for input(s): PROBNP in the last 8760 hours. HbA1C: No results for input(s): HGBA1C in the last 72 hours. CBG: No results for input(s): GLUCAP in the last 168 hours. Lipid Profile: No results for input(s): CHOL, HDL, LDLCALC, TRIG, CHOLHDL, LDLDIRECT in the last 72 hours. Thyroid Function Tests: No results for input(s): TSH, T4TOTAL, FREET4, T3FREE, THYROIDAB in the last 72 hours. Anemia Panel: No results for input(s): VITAMINB12, FOLATE, FERRITIN, TIBC, IRON, RETICCTPCT in the last 72 hours. Urine analysis: No results found for: COLORURINE, APPEARANCEUR, LABSPEC, PHURINE, GLUCOSEU, HGBUR, BILIRUBINUR, KETONESUR, PROTEINUR, UROBILINOGEN, NITRITE, LEUKOCYTESUR  Radiological Exams on Admission: Dg Chest 2 View  Result Date: 04/19/2017 CLINICAL DATA:  Shortness of breath and pain under the ribs EXAM: CHEST  2 VIEW COMPARISON:  None. FINDINGS: Low lung volumes with patchy atelectasis at the bases. No pleural effusion. Normal heart size. No pneumothorax. IMPRESSION: Low lung volumes with patchy atelectasis at the bases. Electronically Signed   By: Jasmine PangKim  Fujinaga M.D.   On: 04/19/2017 23:42   Ct Angio Chest Pe W And/or Wo Contrast  Result Date: 04/20/2017 CLINICAL DATA:  Acute onset of generalized chest pain and shortness of breath. Recent back surgery. EXAM: CT ANGIOGRAPHY CHEST WITH CONTRAST TECHNIQUE: Multidetector CT imaging of the chest was performed using the standard protocol during bolus administration of intravenous contrast. Multiplanar CT image reconstructions and MIPs were obtained to  evaluate the vascular anatomy. CONTRAST:  100mL ISOVUE-370 IOPAMIDOL (ISOVUE-370) INJECTION 76% COMPARISON:  Chest radiograph performed 04/19/2017 FINDINGS: Cardiovascular: Bilateral pulmonary embolus is noted, at the right pulmonary artery, and at segmental branches of the left pulmonary artery. Pulmonary embolus extends into all lobes of both lungs. The RV/LV ratio of 1.3 corresponds to right heart strain and at least submassive pulmonary embolus. The heart is borderline normal in size. The thoracic aorta is unremarkable. The great vessels are grossly unremarkable in appearance. Mediastinum/Nodes: The mediastinum is unremarkable in appearance. No mediastinal lymphadenopathy is seen. No pericardial effusion is identified. The visualized portions of the thyroid gland are unremarkable. No axillary lymphadenopathy is appreciated. Lungs/Pleura: Small areas of pulmonary infarct are noted within the right lower lobe. Patchy opacity at the left lower lobe is thought to reflect atelectasis. No pleural effusion or pneumothorax is seen. No masses are identified. Upper Abdomen: The visualized portions of the liver and spleen are unremarkable. Musculoskeletal: No acute osseous abnormalities are identified. The visualized musculature is unremarkable in appearance. Review of the MIP images confirms the above findings. IMPRESSION: 1. Bilateral pulmonary embolus, at the right pulmonary  artery, and at segmental branches of the left pulmonary artery. Pulmonary embolus extends into all lobes of both lungs. CT evidence of right heart strain (RV/LV Ratio = 1.3) consistent with at least submassive (intermediate risk) PE. The presence of right heart strain has been associated with an increased risk of morbidity and mortality. Please activate Code PE by paging [phone]. 2. Small areas of pulmonary infarct within the right lower lung lobe. Patchy opacity at the left lower lobe is thought to reflect atelectasis. Critical Value/emergent  results were called by telephone at the time of interpretation on 04/20/2017 at 1:38 am to Dr. Cy Blamer, who verbally acknowledged these results. Electronically Signed   By: Roanna Raider M.D.   On: 04/20/2017 01:40    EKG: Independently reviewed.  Assessment/Plan Principal Problem:   Pulmonary embolism (HCC) Active Problems:   S/P lumbar laminectomy    1. Bilateral pulmonary emboli - 1. Heparin gtt 2. 2d echo 3. Venous duplex 2. S/P Lumbar Laminectomy - 10 days ago 1. EDP called NS who said okay to go ahead with heparin, will see tomorrow   DVT prophylaxis: Heparin gtt Code Status: Full Family Communication: Wife at bedside Disposition Plan: Home after admit Consults called: EDP spoke with neuro surgery who will see patient tomorrow Admission status: Place in 29   Hillary Bow. DO [facility] Pager [phone]  If 7AM-7PM, please contact day team taking care of patient [email or web address] Password [email or web address]  04/20/2017, 2:51 AM

## 2017-04-20 NOTE — Progress Notes (Signed)
  Echocardiogram 2D Echocardiogram has been performed.  Keith Austin 04/20/2017, 2:31 PM

## 2017-04-20 NOTE — Progress Notes (Signed)
*  Preliminary Results* Bilateral lower extremity venous duplex completed. The right lower extremity is positive for acute deep vein thrombosis involving the right peroneal veins. The left lower extremity is negative for deep vein thrombosis. There is no evidence of Baker's cyst bilaterally.  Preliminary results discussed with Dr. Elisabeth Pigeonevine.  04/20/2017 10:45 AM Gertie FeyMichelle Derionna Salvador, BS, RVT, RDCS, RDMS

## 2017-04-20 NOTE — ED Notes (Signed)
Please call Katie for report at [phone] at [phone]

## 2017-04-20 NOTE — ED Provider Notes (Signed)
[facility]-EMERGENCY DEPT Provider Note   CSN: [id number] Arrival date & time: 04/19/17  2127     History   Chief Complaint Chief Complaint  Patient presents with  . Shortness of Breath    HPI Keith Austin is a 46 y.o. male.  The history is provided by the patient.  Chest Pain   This is a new problem. The current episode started 2 days ago. The problem occurs constantly. The problem has not changed since onset.The pain is associated with rest and breathing. The pain is present in the lateral region. The pain is at a severity of 10/10. The pain is severe. The quality of the pain is described as pleuritic. The pain does not radiate. The symptoms are aggravated by deep breathing. Associated symptoms include shortness of breath. Pertinent negatives include no abdominal pain, no diaphoresis, no fever and no palpitations. He has tried nothing for the symptoms. The treatment provided no relief. Risk factors include male gender and obesity.  Pertinent negatives for past medical history include no Marfan's syndrome.  Pertinent negatives for family medical history include: no Marfan's syndrome.  Procedure history is negative for EPS study.    Past Medical History:  Diagnosis Date  . Back pain    LUMBAR   . Psoriasis    PRESENTLY EXPERIENCING   . Sciatica   . Sinus bradycardia by electrocardiogram SEE 2013 EOG EPIC    Patient Active Problem List   Diagnosis Date Noted  . Spinal stenosis, lumbar 04/10/2017  . HNP (herniated nucleus pulposus), lumbar 04/10/2017    Past Surgical History:  Procedure Laterality Date  . LUMBAR LAMINECTOMY/DECOMPRESSION MICRODISCECTOMY Right 04/10/2017   Procedure: Microlumbar Decompression L5-S1 right; bilateral foraminotomy;  Surgeon: Jene EveryBeane, Jeffrey, MD;  Location: [facility] ORS;  Service: Orthopedics;  Laterality: Right;  90 mins  . SHOULDER SURGERY Left 5-6- YEARS AGO   "SHAVE AND A TORN BICEP "        Home Medications     Prior to Admission medications   Medication Sig Start Date End Date Taking? Authorizing Provider  aspirin 81 MG chewable tablet Chew 81 mg by mouth daily.   Yes [provider]  docusate sodium (COLACE) 100 MG capsule Take 1 capsule (100 mg total) by mouth 2 (two) times daily as needed for mild constipation. 04/10/17  Yes Jene EveryBeane, Jeffrey, MD  methocarbamol (ROBAXIN) 500 MG tablet Take 1 tablet (500 mg total) by mouth every 6 (six) hours as needed for muscle spasms. 04/10/17  Yes Jene EveryBeane, Jeffrey, MD  oxyCODONE-acetaminophen (PERCOCET/ROXICET) 5-325 MG tablet Take 1-2 tablets by mouth every 4 (four) hours as needed for moderate pain. 04/10/17  Yes Jene EveryBeane, Jeffrey, MD    Family History History reviewed. No pertinent family history.  Social History Social History   Tobacco Use  . Smoking status: Never Smoker  . Smokeless tobacco: Never Used  Substance Use Topics  . Alcohol use: No  . Drug use: Not on file     Allergies   Patient has no known allergies.   Review of Systems Review of Systems  Constitutional: Negative for chills, diaphoresis and fever.  Respiratory: Positive for shortness of breath.   Cardiovascular: Positive for chest pain. Negative for palpitations and leg swelling.  Gastrointestinal: Negative for abdominal pain.  All other systems reviewed and are negative.    Physical Exam Updated Vital Signs BP 130/83 (BP Location: Left Arm)   Pulse 88   Temp 99 F (37.2 C) (Oral)   Resp (!) 21  Ht 6' (1.829 m)   Wt 111.1 kg (245 lb)   SpO2 93%   BMI 33.23 kg/m   Physical Exam  Constitutional: He is oriented to person, place, and time. He appears well-developed and well-nourished. No distress.  HENT:  Head: Normocephalic and atraumatic.  Mouth/Throat: No oropharyngeal exudate.  Eyes: Conjunctivae are normal. Pupils are equal, round, and reactive to light.  Neck: Normal range of motion. Neck supple.  Cardiovascular: Normal rate, regular rhythm, normal  heart sounds and intact distal pulses.  Pulmonary/Chest: Effort normal and breath sounds normal. No stridor. No respiratory distress. He has no wheezes.  Abdominal: Soft. Bowel sounds are normal. There is no tenderness.  Musculoskeletal: Normal range of motion.  Neurological: He is alert and oriented to person, place, and time. He displays normal reflexes.  Skin: Skin is warm and dry. Capillary refill takes less than 2 seconds.      ED Treatments / Results  Labs (all labs ordered are listed, but only abnormal results are displayed) Labs Reviewed  BASIC METABOLIC PANEL - Abnormal; Notable for the following components:      Result Value   Sodium 133 (*)    Chloride 99 (*)    Glucose, Bld 126 (*)    Creatinine, Ser 1.41 (*)    Calcium 8.7 (*)    GFR calc non Af Amer 58 (*)    All other components within normal limits  CBC - Abnormal; Notable for the following components:   WBC 13.7 (*)    All other components within normal limits  I-STAT TROPONIN, ED    EKG  EKG Interpretation  Date/Time:  Friday April 19 2017 21:38:30 EST Ventricular Rate:  86 PR Interval:    QRS Duration: 88 QT Interval:  341 QTC Calculation: 408 R Axis:   73 Text Interpretation:  Sinus rhythm ST elev, probable normal early repol pattern Baseline wander in lead(s) V3 No old tracing to compare Confirmed by Rolan BuccoBelfi, Melanie [phone]([id number]) on 04/19/2017 9:42:29 PM       Radiology Dg Chest 2 View  Result Date: 04/19/2017 CLINICAL DATA:  Shortness of breath and pain under the ribs EXAM: CHEST  2 VIEW COMPARISON:  None. FINDINGS: Low lung volumes with patchy atelectasis at the bases. No pleural effusion. Normal heart size. No pneumothorax. IMPRESSION: Low lung volumes with patchy atelectasis at the bases. Electronically Signed   By: Jasmine PangKim  Fujinaga M.D.   On: 04/19/2017 23:42    Procedures Procedures (including critical care time)  Medications Ordered in ED  Medications  0.9 %  sodium chloride infusion (  Intravenous New Bag/Given 04/20/17 0006)  iopamidol (ISOVUE-370) 76 % injection (not administered)  sodium chloride 0.9 % injection (not administered)  heparin bolus via infusion 6,666 Units (not administered)  heparin ADULT infusion 100 units/mL (25000 units/27150mL sodium chloride 0.45%) (not administered)  fentaNYL (SUBLIMAZE) injection 50 mcg (50 mcg Intravenous Given 04/20/17 0006)  methocarbamol (ROBAXIN) 1,000 mg in dextrose 5 % 50 mL IVPB (0 mg Intravenous Stopped 04/20/17 0041)  iopamidol (ISOVUE-370) 76 % injection 100 mL (100 mLs Intravenous Contrast Given 04/20/17 0052)  fentaNYL (SUBLIMAZE) injection 100 mcg (100 mcg Intravenous Given 04/20/17 0126)   Case d/w Dr. Aundria Rudogers of orthopedics ok to start heparin  MDM Number of Diagnoses or Management Options Other acute pulmonary embolism without acute cor pulmonale [facility](HCC):  Critical Care Total time providing critical care: 75-105 minutes MDM Reviewed: previous chart, nursing note and vitals Reviewed previous: labs Interpretation: labs, ECG, x-ray and CT scan (  normal troponin x 2 negative CXR by me PE on CT by me ) Total time providing critical care: 75-105 minutes. This excludes time spent performing separately reportable procedures and services. Consults: admitting MD and orthopedics  CRITICAL CARE Performed by: Jasmine Awe Total critical care time: 90 minutes Critical care time was exclusive of separately billable procedures and treating other patients. Critical care was necessary to treat or prevent imminent or life-threatening deterioration. Critical care was time spent personally by me on the following activities: development of treatment plan with patient and/or surrogate as well as nursing, discussions with consultants, evaluation of patient's response to treatment, examination of patient, obtaining history from patient or surrogate, ordering and performing treatments and interventions, ordering and review of  laboratory studies, ordering and review of radiographic studies, pulse oximetry and re-evaluation of patient's condition.   Final Clinical Impressions(s) / ED Diagnoses  PEs will admit to medicine   Ysidro Ramsay, MD 04/20/17 0151

## 2017-04-20 NOTE — ED Notes (Signed)
Patient transported to CT 

## 2017-04-20 NOTE — Progress Notes (Signed)
ANTICOAGULATION CONSULT NOTE - Follow - Up  Pharmacy Consult for IV heparin Indication: pulmonary embolus  No Known Allergies  Patient Measurements: Height: 6' (182.9 cm) Weight: 247 lb 9.2 oz (112.3 kg) IBW/kg (Calculated) : 77.6 Heparin Dosing Weight: 87 kg  Vital Signs: Temp: 99.8 F (37.7 C) (12/29 1340) Temp Source: Oral (12/29 1340) BP: 125/79 (12/29 1340) Pulse Rate: 94 (12/29 1340)  Labs: Recent Labs    04/19/17 2159 04/20/17 0752 04/20/17 1346  HGB 13.5 12.6*  --   HCT 40.7 38.8*  --   PLT 246 236  --   APTT 34  --   --   LABPROT 13.1  --   --   INR 1.00  --   --   HEPARINUNFRC  --  0.63 0.62  CREATININE 1.41*  --   --     Estimated Creatinine Clearance: 82.9 mL/min (A) (by C-G formula based on SCr of 1.41 mg/dL (H)).   Medical History: Past Medical History:  Diagnosis Date  . Back pain    LUMBAR   . Psoriasis    PRESENTLY EXPERIENCING   . Sciatica   . Sinus bradycardia by electrocardiogram SEE 2013 EOG EPIC    Medications:  Scheduled:   Infusions:  . sodium chloride 125 mL/hr at 04/20/17 0912  . heparin 1,800 Units/hr (04/20/17 1423)    Assessment: 46 yoM c/o SOB. Bilateral PE with right heart strain on CT. IV heparin for PE.  No anticoagulants PTA  Baseline Labs  PT 13,1 INR 1.00  Hgb 13.5, plt 246  SCr 1.41, CrCl , 83 ml/min   Today,04/20/17   Hgb 12.6, plt 236, WNL  First HL is 0.63, therapeutic  Confirmatory level is 0.62, therapeutic  No bleeding or line issues per RN     Goal of Therapy:  Heparin level 0.3-0.7 units/ml Monitor platelets by anticoagulation protocol: Yes   Plan:   Continue heparin drip at 1800 units/hr  Daily HL  Daily CBC   Monitor for signs and symptoms of bleeding   Adalberto ColeNikola Lauralyn Shadowens, PharmD, BCPS Pager [phone] 04/20/2017 3:19 PM

## 2017-04-21 LAB — BASIC METABOLIC PANEL
ANION GAP: 6 (ref 5–15)
BUN: 10 mg/dL (ref 6–20)
CHLORIDE: 100 mmol/L — AB (ref 101–111)
CO2: 30 mmol/L (ref 22–32)
Calcium: 8.7 mg/dL — ABNORMAL LOW (ref 8.9–10.3)
Creatinine, Ser: 1.31 mg/dL — ABNORMAL HIGH (ref 0.61–1.24)
Glucose, Bld: 108 mg/dL — ABNORMAL HIGH (ref 65–99)
POTASSIUM: 4.6 mmol/L (ref 3.5–5.1)
SODIUM: 136 mmol/L (ref 135–145)

## 2017-04-21 LAB — CBC
HCT: 36.9 % — ABNORMAL LOW (ref 39.0–52.0)
HEMOGLOBIN: 11.8 g/dL — AB (ref 13.0–17.0)
MCH: 30.8 pg (ref 26.0–34.0)
MCHC: 32 g/dL (ref 30.0–36.0)
MCV: 96.3 fL (ref 78.0–100.0)
PLATELETS: 229 10*3/uL (ref 150–400)
RBC: 3.83 MIL/uL — AB (ref 4.22–5.81)
RDW: 13.2 % (ref 11.5–15.5)
WBC: 12.4 10*3/uL — AB (ref 4.0–10.5)

## 2017-04-21 LAB — HEPARIN LEVEL (UNFRACTIONATED): HEPARIN UNFRACTIONATED: 0.52 [IU]/mL (ref 0.30–0.70)

## 2017-04-21 MED ORDER — SENNA 8.6 MG PO TABS
2.0000 | ORAL_TABLET | Freq: Once | ORAL | Status: AC
  Administered 2017-04-21: 17.2 mg via ORAL
  Filled 2017-04-21: qty 2

## 2017-04-21 MED ORDER — RIVAROXABAN 15 MG PO TABS
15.0000 mg | ORAL_TABLET | Freq: Two times a day (BID) | ORAL | Status: DC
  Administered 2017-04-21 – 2017-04-22 (×3): 15 mg via ORAL
  Filled 2017-04-21 (×3): qty 1

## 2017-04-21 NOTE — Progress Notes (Addendum)
ANTICOAGULATION CONSULT NOTE - Follow - Up  Pharmacy Consult for IV heparin >> rivaroxaban Indication: pulmonary embolus  No Known Allergies  Patient Measurements: Height: 6' (182.9 cm) Weight: 247 lb 9.2 oz (112.3 kg) IBW/kg (Calculated) : 77.6 Heparin Dosing Weight: 87 kg  Vital Signs: Temp: 99.4 F (37.4 C) (12/30 0430) Temp Source: Oral (12/30 0430) BP: 126/86 (12/30 0430) Pulse Rate: 87 (12/30 0430)  Labs: Recent Labs    04/19/17 2159 04/20/17 0752 04/20/17 1346 04/21/17 0509  HGB 13.5 12.6*  --  11.8*  HCT 40.7 38.8*  --  36.9*  PLT 246 236  --  229  APTT 34  --   --   --   LABPROT 13.1  --   --   --   INR 1.00  --   --   --   HEPARINUNFRC  --  0.63 0.62 0.52  CREATININE 1.41*  --   --  1.31*    Estimated Creatinine Clearance: 89.2 mL/min (A) (by C-G formula based on SCr of 1.31 mg/dL (H)).   Medical History: Past Medical History:  Diagnosis Date  . Back pain    LUMBAR   . Psoriasis    PRESENTLY EXPERIENCING   . Sciatica   . Sinus bradycardia by electrocardiogram SEE 2013 EOG EPIC    Medications:  Scheduled:   Infusions:  . sodium chloride 125 mL/hr at 04/21/17 0741  . heparin 1,800 Units/hr (04/21/17 0315)    Assessment: 46 yoM c/o SOB. Bilateral PE with right heart strain on CT. IV heparin for PE.  No anticoagulants PTA  Baseline Labs  PT 13,1 INR 1.00  Hgb 13.5, plt 246  SCr 1.41, CrCl , 83 ml/min   Today,04/21/17   Hgb 11.8, plt 229, WNL  HL level is 0.52  No bleeding or line issues per RN     Goal of Therapy:  Heparin level 0.3-0.7 units/ml Monitor platelets by anticoagulation protocol: Yes   Plan:   Pt to transition from heparin to rivaroxaban today   Rivaroxaban 15 mg PO BID with meals x 21 days, followed by rivaroxaban 20 mg PO daily with supper  Patient education provided   Trial coupon card provided  Monitor for signs and symptoms of bleeding   Adalberto ColeNikola Marny Smethers, PharmD, BCPS Pager [phone] 04/21/2017  8:27 AM

## 2017-04-21 NOTE — Progress Notes (Signed)
Patient ID: Melina CopaSteven Sawyers, male   DOB: 04/18/1971, 46 y.o.   MRN: [id number]  PROGRESS NOTE    Melina CopaSteven Luddy  WUJ:[id number] DOB: 05/23/1970 DOA: 04/19/2017  PCP: Patient, No Pcp Per   Brief Narrative:  Pt admitted for PE. LE doppler showed DVT in RLE.   Assessment & Plan:   Principal Problem:   Pulmonary embolism (HCC) - Stop heparin and use xarelto in anticipation pt will be discharged tomorrow  Active Problems:   S/P lumbar laminectomy - Stable    RLE DVT - On xarelto    DVT prophylaxis: On Xarelto  Code Status: full code  Family Communication: spoke with wife this am Disposition Plan: home in am   Consultants:   None  Procedures:   Doppler - Right leg DVT  Antimicrobials:   None     Subjective: Chest pain on and off.  Objective: Vitals:   04/20/17 1340 04/20/17 2138 04/21/17 0430 04/21/17 1337  BP: 125/79 126/74 126/86 120/76  Pulse: 94 84 87 99  Resp: 18 18 20 18   Temp: 99.8 F (37.7 C) 98.8 F (37.1 C) 99.4 F (37.4 C) 98.8 F (37.1 C)  TempSrc: Oral Oral Oral Oral  SpO2: 96% 97% 97% 94%  Weight:      Height:        Intake/Output Summary (Last 24 hours) at 04/21/2017 1536 Last data filed at 04/20/2017 2200 Gross per 24 hour  Intake 2000 ml  Output -  Net 2000 ml   Filed Weights   04/19/17 2132 04/20/17 0348  Weight: 111.1 kg (245 lb) 112.3 kg (247 lb 9.2 oz)    Examination:  General exam: Appears calm and comfortable  Respiratory system: Clear to auscultation. Respiratory effort normal. Cardiovascular system: S1 & S2 heard, RRR.  Gastrointestinal system: Abdomen is nondistended, soft and nontender. No organomegaly or masses felt. Normal bowel sounds heard. Central nervous system: Alert and oriented. No focal neurological deficits. Extremities: Symmetric 5 x 5 power. Skin: No rashes, lesions or ulcers Psychiatry: Judgement and insight appear normal. Mood & affect appropriate.   Data Reviewed: I have personally reviewed  following labs and imaging studies  CBC: Recent Labs  Lab 04/19/17 2159 04/20/17 0752 04/21/17 0509  WBC 13.7* 14.4* 12.4*  HGB 13.5 12.6* 11.8*  HCT 40.7 38.8* 36.9*  MCV 94.4 95.3 96.3  PLT 246 236 229   Basic Metabolic Panel: Recent Labs  Lab 04/19/17 2159 04/21/17 0509  NA 133* 136  K 4.5 4.6  CL 99* 100*  CO2 27 30  GLUCOSE 126* 108*  BUN 11 10  CREATININE 1.41* 1.31*  CALCIUM 8.7* 8.7*   GFR: Estimated Creatinine Clearance: 89.2 mL/min (A) (by C-G formula based on SCr of 1.31 mg/dL (H)). Liver Function Tests: No results for input(s): AST, ALT, ALKPHOS, BILITOT, PROT, ALBUMIN in the last 168 hours. No results for input(s): LIPASE, AMYLASE in the last 168 hours. No results for input(s): AMMONIA in the last 168 hours. Coagulation Profile: Recent Labs  Lab 04/19/17 2159  INR 1.00   Cardiac Enzymes: No results for input(s): CKTOTAL, CKMB, CKMBINDEX, TROPONINI in the last 168 hours. BNP (last 3 results) No results for input(s): PROBNP in the last 8760 hours. HbA1C: No results for input(s): HGBA1C in the last 72 hours. CBG: No results for input(s): GLUCAP in the last 168 hours. Lipid Profile: No results for input(s): CHOL, HDL, LDLCALC, TRIG, CHOLHDL, LDLDIRECT in the last 72 hours. Thyroid Function Tests: No results for input(s): TSH, T4TOTAL, FREET4, T3FREE,  THYROIDAB in the last 72 hours. Anemia Panel: No results for input(s): VITAMINB12, FOLATE, FERRITIN, TIBC, IRON, RETICCTPCT in the last 72 hours. Urine analysis: No results found for: COLORURINE, APPEARANCEUR, LABSPEC, PHURINE, GLUCOSEU, HGBUR, BILIRUBINUR, KETONESUR, PROTEINUR, UROBILINOGEN, NITRITE, LEUKOCYTESUR Sepsis Labs: @LABRCNTIP (procalcitonin:4,lacticidven:4)   )No results found for this or any previous visit (from the past 240 hour(s)).    Radiology Studies: Dg Chest 2 View  Result Date: 04/19/2017 CLINICAL DATA:  Shortness of breath and pain under the ribs EXAM: CHEST  2 VIEW  COMPARISON:  None. FINDINGS: Low lung volumes with patchy atelectasis at the bases. No pleural effusion. Normal heart size. No pneumothorax. IMPRESSION: Low lung volumes with patchy atelectasis at the bases. Electronically Signed   By: Jasmine PangKim  Fujinaga M.D.   On: 04/19/2017 23:42   Ct Angio Chest Pe W And/or Wo Contrast  Result Date: 04/20/2017 CLINICAL DATA:  Acute onset of generalized chest pain and shortness of breath. Recent back surgery. EXAM: CT ANGIOGRAPHY CHEST WITH CONTRAST TECHNIQUE: Multidetector CT imaging of the chest was performed using the standard protocol during bolus administration of intravenous contrast. Multiplanar CT image reconstructions and MIPs were obtained to evaluate the vascular anatomy. CONTRAST:  100mL ISOVUE-370 IOPAMIDOL (ISOVUE-370) INJECTION 76% COMPARISON:  Chest radiograph performed 04/19/2017 FINDINGS: Cardiovascular: Bilateral pulmonary embolus is noted, at the right pulmonary artery, and at segmental branches of the left pulmonary artery. Pulmonary embolus extends into all lobes of both lungs. The RV/LV ratio of 1.3 corresponds to right heart strain and at least submassive pulmonary embolus. The heart is borderline normal in size. The thoracic aorta is unremarkable. The great vessels are grossly unremarkable in appearance. Mediastinum/Nodes: The mediastinum is unremarkable in appearance. No mediastinal lymphadenopathy is seen. No pericardial effusion is identified. The visualized portions of the thyroid gland are unremarkable. No axillary lymphadenopathy is appreciated. Lungs/Pleura: Small areas of pulmonary infarct are noted within the right lower lobe. Patchy opacity at the left lower lobe is thought to reflect atelectasis. No pleural effusion or pneumothorax is seen. No masses are identified. Upper Abdomen: The visualized portions of the liver and spleen are unremarkable. Musculoskeletal: No acute osseous abnormalities are identified. The visualized musculature is  unremarkable in appearance. Review of the MIP images confirms the above findings. IMPRESSION: 1. Bilateral pulmonary embolus, at the right pulmonary artery, and at segmental branches of the left pulmonary artery. Pulmonary embolus extends into all lobes of both lungs. CT evidence of right heart strain (RV/LV Ratio = 1.3) consistent with at least submassive (intermediate risk) PE. The presence of right heart strain has been associated with an increased risk of morbidity and mortality. Please activate Code PE by paging [phone]. 2. Small areas of pulmonary infarct within the right lower lung lobe. Patchy opacity at the left lower lobe is thought to reflect atelectasis. Critical Value/emergent results were called by telephone at the time of interpretation on 04/20/2017 at 1:38 am to Dr. Cy BlamerAPRIL PALUMBO, who verbally acknowledged these results. Electronically Signed   By: Roanna RaiderJeffery  Chang M.D.   On: 04/20/2017 01:40       Scheduled Meds: . rivaroxaban  15 mg Oral BID WC   Continuous Infusions:   LOS: 1 day    Time spent: 25 minutes  Greater than 50% of the time spent on counseling and coordinating the care.   Manson PasseyAlma Chasyn Cinque, MD [facility] Pager [phone]  If 7PM-7AM, please contact night-coverage [email or web address] Password [email or web address] 04/21/2017, 3:36 PM

## 2017-04-21 NOTE — Plan of Care (Signed)
  Progressing Education: Knowledge of General Education information will improve 04/21/2017 2207 - Progressing by Cristela FeltSteffens, Taronda Comacho P, RN Health Behavior/Discharge Planning: Ability to manage health-related needs will improve 04/21/2017 2207 - Progressing by Cristela FeltSteffens, Sotiria Keast P, RN Clinical Measurements: Ability to maintain clinical measurements within normal limits will improve 04/21/2017 2207 - Progressing by Cristela FeltSteffens, Adante Courington P, RN Will remain free from infection 04/21/2017 2207 - Progressing by Cristela FeltSteffens, Shanah Guimaraes P, RN Diagnostic test results will improve 04/21/2017 2207 - Progressing by Cristela FeltSteffens, Skila Rollins P, RN Respiratory complications will improve 04/21/2017 2207 - Progressing by Cristela FeltSteffens, Karrie Fluellen P, RN Cardiovascular complication will be avoided 04/21/2017 2207 - Progressing by Cristela FeltSteffens, Christabella Alvira P, RN Activity: Risk for activity intolerance will decrease 04/21/2017 2207 - Progressing by Cristela FeltSteffens, Rakan Soffer P, RN Nutrition: Adequate nutrition will be maintained 04/21/2017 2207 - Progressing by Cristela FeltSteffens, Zaida Reiland P, RN Coping: Level of anxiety will decrease 04/21/2017 2207 - Progressing by Cristela FeltSteffens, Stacy Sailer P, RN Elimination: Will not experience complications related to urinary retention 04/21/2017 2207 - Progressing by Cristela FeltSteffens, Edwen Mclester P, RN Pain Managment: General experience of comfort will improve 04/21/2017 2207 - Progressing by Cristela FeltSteffens, Nixxon Faria P, RN Safety: Ability to remain free from injury will improve 04/21/2017 2207 - Progressing by Cristela FeltSteffens, Cullin Dishman P, RN Skin Integrity: Risk for impaired skin integrity will decrease 04/21/2017 2207 - Progressing by Cristela FeltSteffens, Kindall Swaby P, RN

## 2017-04-21 NOTE — Discharge Instructions (Signed)
Information on my medicine - XARELTO (rivaroxaban)  This medication education was reviewed with me or my healthcare representative as part of my discharge preparation.  The pharmacist that spoke with me during my hospital stay was:  Brandom Kerwin, [facility]  WHY WAS XARELTO PRESCRIBED FOR YOU? Xarelto was prescribed to treat blood clots that may have been found in the veins of your legs (deep vein thrombosis) or in your lungs (pulmonary embolism) and to reduce the risk of them occurring again.  What do you need to know about Xarelto? The starting dose is one 15 mg tablet taken TWICE daily with food for the FIRST 21 DAYS then on (enter date)  05/12/17  the dose is changed to one 20 mg tablet taken ONCE A DAY with your evening meal.  DO NOT stop taking Xarelto without talking to the health care provider who prescribed the medication.  Refill your prescription for 20 mg tablets before you run out.  After discharge, you should have regular check-up appointments with your healthcare provider that is prescribing your Xarelto.  In the future your dose may need to be changed if your kidney function changes by a significant amount.  What do you do if you miss a dose? If you are taking Xarelto TWICE DAILY and you miss a dose, take it as soon as you remember. You may take two 15 mg tablets (total 30 mg) at the same time then resume your regularly scheduled 15 mg twice daily the next day.  If you are taking Xarelto ONCE DAILY and you miss a dose, take it as soon as you remember on the same day then continue your regularly scheduled once daily regimen the next day. Do not take two doses of Xarelto at the same time.   Important Safety Information Xarelto is a blood thinner medicine that can cause bleeding. You should call your healthcare provider right away if you experience any of the following: ? Bleeding from an injury or your nose that does not stop. ? Unusual colored urine (red or dark brown) or  unusual colored stools (red or black). ? Unusual bruising for unknown reasons. ? A serious fall or if you hit your head (even if there is no bleeding).  Some medicines may interact with Xarelto and might increase your risk of bleeding while on Xarelto. To help avoid this, consult your healthcare provider or pharmacist prior to using any new prescription or non-prescription medications, including herbals, vitamins, non-steroidal anti-inflammatory drugs (NSAIDs) and supplements.  This website has more information on Xarelto: [email or web address].

## 2017-04-22 LAB — BASIC METABOLIC PANEL
Anion gap: 7 (ref 5–15)
BUN: 11 mg/dL (ref 6–20)
CHLORIDE: 99 mmol/L — AB (ref 101–111)
CO2: 30 mmol/L (ref 22–32)
Calcium: 9 mg/dL (ref 8.9–10.3)
Creatinine, Ser: 1.39 mg/dL — ABNORMAL HIGH (ref 0.61–1.24)
GFR calc Af Amer: >60 mL/min (ref >60)
GFR calc non Af Amer: 59 mL/min — ABNORMAL LOW (ref >60)
GLUCOSE: 106 mg/dL — AB (ref 65–99)
POTASSIUM: 4.2 mmol/L (ref 3.5–5.1)
Sodium: 136 mmol/L (ref 135–145)

## 2017-04-22 LAB — CBC
HEMATOCRIT: 36.8 % — AB (ref 39.0–52.0)
HEMOGLOBIN: 12 g/dL — AB (ref 13.0–17.0)
MCH: 31 pg (ref 26.0–34.0)
MCHC: 32.6 g/dL (ref 30.0–36.0)
MCV: 95.1 fL (ref 78.0–100.0)
Platelets: 263 10*3/uL (ref 150–400)
RBC: 3.87 MIL/uL — AB (ref 4.22–5.81)
RDW: 13.1 % (ref 11.5–15.5)
WBC: 9.8 10*3/uL (ref 4.0–10.5)

## 2017-04-22 MED ORDER — RIVAROXABAN (XARELTO) VTE STARTER PACK (15 & 20 MG): ORAL_TABLET | ORAL | 0 refills | Status: DC

## 2017-04-22 MED ORDER — OXYCODONE-ACETAMINOPHEN 5-325 MG PO TABS: 1.0000 | ORAL_TABLET | ORAL | 0 refills | Status: DC | PRN

## 2017-04-22 NOTE — Discharge Summary (Signed)
Physician Discharge Summary  Keith CopaSteven Austin WUJ:[id number] DOB: 01/06/1971 DOA: 04/19/2017  PCP: Patient, No Pcp Per  Admit date: 04/19/2017 Discharge date: 04/22/2017  Recommendations for Outpatient Follow-up:  Continue xarelto on discharge 15 mg twice a day for 21 days and then 20 mg a day from that point on.  Discharge Diagnoses:  Principal Problem:   Pulmonary embolism (HCC) Active Problems:   S/P lumbar laminectomy    Discharge Condition: stable   Diet recommendation: as tolerated   History of present illness:  Pt admitted for PE. LE doppler showed DVT in RLE.  Hospital Course:   Assessment & Plan:   Principal Problem:   Pulmonary embolism (HCC) - Xarelto on discharge   Active Problems:   S/P lumbar laminectomy - Stable    CKD stage 3 - Baseline Cr 1.41 - Cr on this admission 1.4, within baseline range     RLE DVT - On xarelto    DVT prophylaxis: On Xarelto  Code Status: full code  Family Communication: wife at bedside     Consultants:   None  Procedures:   Doppler - Right leg DVT  Antimicrobials:   None       Signed:  Manson PasseyAlma Devine, MD  [facility] 04/22/2017, 12:23 PM  Pager #: [phone]  Time spent in minutes: more than 30 minutes    Discharge Exam: Vitals:   04/21/17 2150 04/22/17 0548  BP: 122/75 (!) 144/76  Pulse: 96 (!) 103  Resp: 18 16  Temp: 100 F (37.8 C) 99.7 F (37.6 C)  SpO2: 93% 97%   Vitals:   04/21/17 0430 04/21/17 1337 04/21/17 2150 04/22/17 0548  BP: 126/86 120/76 122/75 (!) 144/76  Pulse: 87 99 96 (!) 103  Resp: 20 18 18 16   Temp: 99.4 F (37.4 C) 98.8 F (37.1 C) 100 F (37.8 C) 99.7 F (37.6 C)  TempSrc: Oral Oral Oral Oral  SpO2: 97% 94% 93% 97%  Weight:      Height:        General: Pt is alert, follows commands appropriately, not in acute distress Cardiovascular: Regular rate and rhythm, S1/S2 +, no murmurs Respiratory: Clear to auscultation bilaterally, no  wheezing, no crackles, no rhonchi Abdominal: Soft, non tender, non distended, bowel sounds +, no guarding Extremities: no edema, no cyanosis, pulses palpable bilaterally DP and PT Neuro: Grossly nonfocal  Discharge Instructions  Discharge Instructions    Call MD for:  persistant nausea and vomiting   Complete by:  As directed    Call MD for:  redness, tenderness, or signs of infection (pain, swelling, redness, odor or green/yellow discharge around incision site)   Complete by:  As directed    Call MD for:  severe uncontrolled pain   Complete by:  As directed    Diet - low sodium heart healthy   Complete by:  As directed    Diet - low sodium heart healthy   Complete by:  As directed    Discharge instructions   Complete by:  As directed    Continue xarelto on discharge 15 mg twice a day for 21 days and then 20 mg a day from that point on.   Increase activity slowly   Complete by:  As directed    Increase activity slowly   Complete by:  As directed      Allergies as of 04/22/2017   No Known Allergies     Medication List    STOP taking these medications   aspirin 81  MG chewable tablet     TAKE these medications   docusate sodium 100 MG capsule Commonly known as:  COLACE Take 1 capsule (100 mg total) by mouth 2 (two) times daily as needed for mild constipation.   methocarbamol 500 MG tablet Commonly known as:  ROBAXIN Take 1 tablet (500 mg total) by mouth every 6 (six) hours as needed for muscle spasms.   oxyCODONE-acetaminophen 5-325 MG tablet Commonly known as:  PERCOCET/ROXICET Take 1-2 tablets by mouth every 4 (four) hours as needed for moderate pain.   Rivaroxaban 15 & 20 MG Tbpk Take as directed on package: Start with one 15mg  tablet by mouth twice a day with food. On Day 22, switch to one 20mg  tablet once a day with food.         The results of significant diagnostics from this hospitalization (including imaging, microbiology, ancillary and laboratory) are  listed below for reference.    Significant Diagnostic Studies: Dg Chest 2 View  Result Date: 04/19/2017 CLINICAL DATA:  Shortness of breath and pain under the ribs EXAM: CHEST  2 VIEW COMPARISON:  None. FINDINGS: Low lung volumes with patchy atelectasis at the bases. No pleural effusion. Normal heart size. No pneumothorax. IMPRESSION: Low lung volumes with patchy atelectasis at the bases. Electronically Signed   By: Jasmine PangKim  Fujinaga M.D.   On: 04/19/2017 23:42   Dg Lumbar Spine 2-3 Views  Result Date: 04/05/2017 CLINICAL DATA:  Preoperative evaluation for lumbar surgery. EXAM: LUMBAR SPINE - 2-3 VIEW COMPARISON:  11/26/2016. FINDINGS: Five lumbar type vertebral bodies show normal alignment. Disc space narrowing L5-S1. No fracture or focal lesion. Mild lower lumbar facet arthritis. IMPRESSION: Five lumbar type vertebral bodies numbered for operative correlation. Disc space narrowing L5-S1. Electronically Signed   By: Paulina FusiMark  Shogry M.D.   On: 04/05/2017 15:03   Ct Angio Chest Pe W And/or Wo Contrast  Result Date: 04/20/2017 CLINICAL DATA:  Acute onset of generalized chest pain and shortness of breath. Recent back surgery. EXAM: CT ANGIOGRAPHY CHEST WITH CONTRAST TECHNIQUE: Multidetector CT imaging of the chest was performed using the standard protocol during bolus administration of intravenous contrast. Multiplanar CT image reconstructions and MIPs were obtained to evaluate the vascular anatomy. CONTRAST:  100mL ISOVUE-370 IOPAMIDOL (ISOVUE-370) INJECTION 76% COMPARISON:  Chest radiograph performed 04/19/2017 FINDINGS: Cardiovascular: Bilateral pulmonary embolus is noted, at the right pulmonary artery, and at segmental branches of the left pulmonary artery. Pulmonary embolus extends into all lobes of both lungs. The RV/LV ratio of 1.3 corresponds to right heart strain and at least submassive pulmonary embolus. The heart is borderline normal in size. The thoracic aorta is unremarkable. The great vessels are  grossly unremarkable in appearance. Mediastinum/Nodes: The mediastinum is unremarkable in appearance. No mediastinal lymphadenopathy is seen. No pericardial effusion is identified. The visualized portions of the thyroid gland are unremarkable. No axillary lymphadenopathy is appreciated. Lungs/Pleura: Small areas of pulmonary infarct are noted within the right lower lobe. Patchy opacity at the left lower lobe is thought to reflect atelectasis. No pleural effusion or pneumothorax is seen. No masses are identified. Upper Abdomen: The visualized portions of the liver and spleen are unremarkable. Musculoskeletal: No acute osseous abnormalities are identified. The visualized musculature is unremarkable in appearance. Review of the MIP images confirms the above findings. IMPRESSION: 1. Bilateral pulmonary embolus, at the right pulmonary artery, and at segmental branches of the left pulmonary artery. Pulmonary embolus extends into all lobes of both lungs. CT evidence of right heart strain (RV/LV Ratio =  1.3) consistent with at least submassive (intermediate risk) PE. The presence of right heart strain has been associated with an increased risk of morbidity and mortality. Please activate Code PE by paging [phone]. 2. Small areas of pulmonary infarct within the right lower lung lobe. Patchy opacity at the left lower lobe is thought to reflect atelectasis. Critical Value/emergent results were called by telephone at the time of interpretation on 04/20/2017 at 1:38 am to Dr. Cy Blamer, who verbally acknowledged these results. Electronically Signed   By: Roanna Raider M.D.   On: 04/20/2017 01:40   Dg Spine Portable 1 View  Result Date: 04/10/2017 CLINICAL DATA:  Intraoperative localization. EXAM: PORTABLE SPINE - 1 VIEW COMPARISON:  Earlier today at 8:53 a.m. FINDINGS: Spinal numbering based on preoperative radiograph 04/05/2017. A probe projects over the L5-S1 narrowed disc space, which is labeled on the solitary  image. Stable retractor positioning. IMPRESSION: Intraoperative localization at L5-S1. Electronically Signed   By: Marnee Spring M.D.   On: 04/10/2017 09:57   Dg Spine Portable 1 View  Result Date: 04/10/2017 CLINICAL DATA:  Localization radiograph EXAM: PORTABLE SPINE - 1 VIEW COMPARISON:  Lateral lumbar spine radiograph of earlier today. FINDINGS: The metallic probe projects 2.2 cm posterior to the posterior margin of the L5-S1 disc space. IMPRESSION: The surgical probe lies 2.2 cm posterior to the L5-S1 disc space. Electronically Signed   By: David  [place] M.D.   On: 04/10/2017 09:20   Dg Spine Portable 1 View  Result Date: 04/10/2017 CLINICAL DATA:  Intraoperative localization radiograph prior to L5-S1 lumbar decompression on the right. EXAM: PORTABLE SPINE - 1 VIEW COMPARISON:  Lumbar spine series of April 05, 2017 FINDINGS: The upper metallic needle lies approximately 8 mm posterior to the posterior margin of the L4 spinous process. The lower needle projects 2.2 cm from the posterior inferior margin of the L5 spinous process. IMPRESSION: The metallic needles lie posterior to the L4 and L5 spinous processes as described. Electronically Signed   By: David  [place] M.D.   On: 04/10/2017 09:07    Microbiology: No results found for this or any previous visit (from the past 240 hour(s)).   Labs: Basic Metabolic Panel: Recent Labs  Lab 04/19/17 2159 04/21/17 0509 04/22/17 0533  NA 133* 136 136  K 4.5 4.6 4.2  CL 99* 100* 99*  CO2 27 30 30   GLUCOSE 126* 108* 106*  BUN 11 10 11   CREATININE 1.41* 1.31* 1.39*  CALCIUM 8.7* 8.7* 9.0   Liver Function Tests: No results for input(s): AST, ALT, ALKPHOS, BILITOT, PROT, ALBUMIN in the last 168 hours. No results for input(s): LIPASE, AMYLASE in the last 168 hours. No results for input(s): AMMONIA in the last 168 hours. CBC: Recent Labs  Lab 04/19/17 2159 04/20/17 0752 04/21/17 0509 04/22/17 0533  WBC 13.7* 14.4* 12.4* 9.8  HGB 13.5  12.6* 11.8* 12.0*  HCT 40.7 38.8* 36.9* 36.8*  MCV 94.4 95.3 96.3 95.1  PLT 246 236 229 263   Cardiac Enzymes: No results for input(s): CKTOTAL, CKMB, CKMBINDEX, TROPONINI in the last 168 hours. BNP: BNP (last 3 results) No results for input(s): BNP in the last 8760 hours.  ProBNP (last 3 results) No results for input(s): PROBNP in the last 8760 hours.  CBG: No results for input(s): GLUCAP in the last 168 hours.

## 2017-05-14 ENCOUNTER — Ambulatory Visit: Payer: Self-pay | Admitting: Family Medicine

## 2017-11-26 ENCOUNTER — Ambulatory Visit (INDEPENDENT_AMBULATORY_CARE_PROVIDER_SITE_OTHER): Payer: Worker's Compensation | Admitting: Internal Medicine

## 2017-11-26 ENCOUNTER — Encounter: Payer: Self-pay | Admitting: Internal Medicine

## 2017-11-26 VITALS — BP 130/90 | HR 82 | Ht 72.0 in | Wt 241.8 lb

## 2017-11-26 DIAGNOSIS — I2699 Other pulmonary embolism without acute cor pulmonale: Secondary | ICD-10-CM

## 2017-11-26 NOTE — Patient Instructions (Addendum)
I recommend:   1)  Repeat venous doppler of The Right - left is not needed   2)  CTangiogram to make sure the blood clot is gone and the infarct has healed - will need a bmet prior to the angiogram   Best case scenario is that if both tests  are normal I will recommend one year of xarelto and either stop it at that point or continue low dose maintenance therapy since your original clot was provoked by the back injury/ immobility post op and your family history is negative but you would be at higher risk of clotting in future with any other events that reduce your mobility.   Pulmonary follow up is as needed

## 2017-11-26 NOTE — Assessment & Plan Note (Addendum)
See CTa 04/19/18 large clot burden,likely with infarcts RLL - See venous dopplers  04/20/18  Pos on R / neg L  - see echo 04/20/18 no RV strain - 11/26/2017  Walked RA x 3 laps @ 185 ft each stopped due to  End of study, fast pace, no sob or desat       He has recovered nicely with minimal residual pleuritic chest discomfort only when taking breathing in excess tidal volume than what he needs at present level of exercise which I've rec he continue to push  And he should expect the pain to gradually dissipate but he is at risk of recurrent clots base on h/o pe but rec  1) repeat cta chest and venous dopplers to assure clearing/ r/o residual infarct/ effusion on R   2) if pos will need to return here > if neg then f/u with PCP with rec to complete one year of doac then consider either half dose long term or d/c it in pt with provoked first clot and neg fm hx)  - I could go either way on this rec but slightly favor stopping the doac unless recurrs, in which case will need lifeflong rx.      Total time devoted to counseling  > 50 % of initial 60 min office visit:  review case with pt/ discussion of options/alternatives/ personally creating written customized instructions  in presence of pt  then going over those specific  Instructions directly with the pt including how to use all of the meds but in particular covering each new medication in detail and the difference between the maintenance= "automatic" meds and the prns using an action plan format for the latter (If this problem/symptom => do that organization reading Left to right).  Please see AVS from this visit for a full list of these instructions which I personally wrote for this pt and  are unique to this visit.
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
## 2017-11-29 ENCOUNTER — Institutional Professional Consult (permissible substitution): Payer: Self-pay | Admitting: Internal Medicine

## 2017-12-06 ENCOUNTER — Telehealth: Payer: Self-pay | Admitting: Internal Medicine

## 2017-12-06 DIAGNOSIS — I2699 Other pulmonary embolism without acute cor pulmonale: Secondary | ICD-10-CM

## 2017-12-06 NOTE — Telephone Encounter (Signed)
See ov  "  repeat cta chest and venous dopplers to assure clearing/ r/o residual infarct/ effusion on R"   So yest, then both need to be done, with the venous doppler only on the R

## 2017-12-06 NOTE — Telephone Encounter (Signed)
    Kathy at [facility] called referring to last OV, 11/26/17.  She is wanting to know if you would like to repeat venous doppler of the right only,and  CTangiogram to make sure the blood clot is gone.  Dr Sherene SiresWert, please advise

## 2017-12-06 NOTE — Addendum Note (Signed)
Addended by: Jaynee EaglesLEMONS, Dhruv Christina C on: 12/06/2017 02:58 PM   Modules accepted: Orders

## 2017-12-06 NOTE — Telephone Encounter (Signed)
Keith Austin at [facility] called referring to last OV, 11/26/17.  She is wanting to know if you  would like to repeat venous doppler of the right only,and   CTangiogram to make sure the blood clot is gone.  Dr Sherene SiresWert, please advise

## 2017-12-06 NOTE — Telephone Encounter (Signed)
Orders have been placed per MW.

## 2017-12-09 NOTE — Telephone Encounter (Signed)
Yes, I thought I already responded that these are the tests he needs but they are correct as listed

## 2017-12-09 NOTE — Telephone Encounter (Signed)
Called and spoke with Keith Austin with [facility] at phone [phone] Confirmed and advised him of MW recommendations and test is already signed Faxed copy of signed order today to fax [phone] with [facility] #[id number] He verbalized understanding, nothing further needed.

## 2017-12-11 ENCOUNTER — Ambulatory Visit (HOSPITAL_COMMUNITY)
Admission: RE | Admit: 2017-12-11 | Discharge: 2017-12-11 | Disposition: A | Discharge status: Home / Self Care | Payer: Worker's Compensation | Source: Ambulatory Visit | Attending: Cardiovascular Disease | Admitting: Cardiovascular Disease

## 2017-12-11 DIAGNOSIS — I2699 Other pulmonary embolism without acute cor pulmonale: Secondary | ICD-10-CM | POA: Diagnosis not present

## 2017-12-12 ENCOUNTER — Ambulatory Visit (INDEPENDENT_AMBULATORY_CARE_PROVIDER_SITE_OTHER)
Admission: RE | Admit: 2017-12-12 | Discharge: 2017-12-12 | Disposition: A | Discharge status: Home / Self Care | Payer: Worker's Compensation | Source: Ambulatory Visit | Attending: Internal Medicine | Admitting: Internal Medicine

## 2017-12-12 DIAGNOSIS — I2699 Other pulmonary embolism without acute cor pulmonale: Secondary | ICD-10-CM

## 2017-12-12 MED ORDER — IOPAMIDOL (ISOVUE-370) INJECTION 76%
80.0000 mL | Freq: Once | INTRAVENOUS | Status: AC | PRN
  Administered 2017-12-12: 80 mL via INTRAVENOUS

## 2017-12-24 ENCOUNTER — Telehealth: Payer: Self-pay | Admitting: Internal Medicine

## 2017-12-24 NOTE — Telephone Encounter (Signed)
Called and spoke with Junious Dresser, she stated that they are needing clearance for the patient to participate in physical therapy. Per Junious Dresser the patients PCP would rather MW give clearance from a pulmonary stand point.  MW please advise, thank you.

## 2017-12-24 NOTE — Telephone Encounter (Signed)
Ok with me 

## 2017-12-24 NOTE — Telephone Encounter (Signed)
LM for Keith Austin making her aware that the letter for clearance for Physical Therapy has been faxed. Nothing further needed at this time.

## 2018-02-11 ENCOUNTER — Telehealth: Payer: Self-pay | Admitting: Internal Medicine

## 2018-02-11 NOTE — Telephone Encounter (Signed)
Per MW verbally- okay to proceed with functional capacity evaluation, however pt should f/u with PCP or NP in office.  First choice being PCP.  Bethann Berkshire with [facility] is aware and voiced her understanding. Bethann Berkshire stated that she would be faxing clearance to our office to be signed by MW. Will leave message in triage until clearance is received.

## 2018-02-11 NOTE — Telephone Encounter (Signed)
Agree with above 

## 2018-02-11 NOTE — Telephone Encounter (Signed)
Called and spoke to [facility] with [place]. Pt states pt is in office for functional capacity evaluation, however his BP is 130/108. Pt is currently in pain.  MW cleared pt for PT, however the capacity evaluation is a little more intense then PT per [place].  Bethann Berkshire wanted to get a verbal to proceed with test, due to BP.  MW please advise. Thanks

## 2018-02-13 NOTE — Telephone Encounter (Signed)
Still awaiting fax

## 2018-02-18 NOTE — Telephone Encounter (Signed)
Have not received anything on this pt

## 2018-02-18 NOTE — Telephone Encounter (Signed)
I have checked MW's look at and this document is not there. Verlon Au - do you know if this has been received? Thanks.

## 2018-02-21 NOTE — Telephone Encounter (Signed)
Called [facility] and spoke with Tammy who stated the form was sent to our office and signed by MW yesterday, 10/31 and had already been faxed back to their office for pt. Nothing further needed.

## 2018-02-21 NOTE — Telephone Encounter (Signed)
Called [facility] to see if a clearance form was still going to be faxed to our office for MW to fill out. Spoke with Bethann Berkshire to see if a clearance form was still going to be sent to our office on pt and per [place], she thought they had already resent the form. Bethann Berkshire stated she would follow up with the person who was supposed to send the form to our office to make sure they would resend it for MW to fill out. I verified the fax number for Trisha. Will leave encounter open until the form has been received and has been filled out by MW to be faxed back to [place].

## 2018-02-21 NOTE — Telephone Encounter (Signed)
Tammy  from [facility]  Physical therapy is calling back [phone]  Ext#[phone]

## 2018-03-28 ENCOUNTER — Encounter: Payer: Self-pay | Admitting: Internal Medicine

## 2018-03-28 ENCOUNTER — Ambulatory Visit (INDEPENDENT_AMBULATORY_CARE_PROVIDER_SITE_OTHER): Payer: Worker's Compensation | Admitting: Internal Medicine

## 2018-03-28 ENCOUNTER — Ambulatory Visit: Payer: Self-pay | Admitting: Internal Medicine

## 2018-03-28 VITALS — BP 140/98 | HR 84 | Ht 72.0 in | Wt 253.0 lb

## 2018-03-28 DIAGNOSIS — I2782 Chronic pulmonary embolism: Secondary | ICD-10-CM

## 2018-03-28 DIAGNOSIS — I10 Essential (primary) hypertension: Secondary | ICD-10-CM
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
## 2018-03-28 NOTE — Patient Instructions (Signed)
Ok to resume xarelto at one half daily   Please schedule a follow up visit in 6  months but call sooner if needed

## 2018-03-30 ENCOUNTER — Encounter: Payer: Self-pay | Admitting: Internal Medicine

## 2018-03-30 DIAGNOSIS — I10 Essential (primary) hypertension: Secondary | ICD-10-CM

## 2018-03-30 HISTORY — DX: Essential (primary) hypertension: I10

## 2018-03-30 NOTE — Assessment & Plan Note (Signed)
Not optimally controlled on present regimen. I reviewed this with the patient and emphasized importance of follow-up with primary care/ avoidance of salt and nsaids in meantime

## 2018-03-30 NOTE — Assessment & Plan Note (Addendum)
Provoked event s/p back surgery 04/10/17 See CTa 04/19/17  large clot burden,likely with infarcts RLL - See venous dopplers  04/20/18  Pos on R / neg L  - see echo 04/20/17 no RV strain - 11/26/2017  Walked RA x 3 laps @ 185 ft each stopped due to  End of study, fast pace, no sob or desat - Venous dopplers 12/11/17  neg  - CT chest 12/12/2017  Neg PE/ tiny R Pl effusion  - 02/2018 stopped xarelto for dental procedures s new symptoms or signs of recurrent dvt or pe  But ongoing risk of based on mobility/ obesity/ and h/o prior dvt    Discussed in detail all the  indications, usual  risks and alternatives  relative to the benefits with patient who agrees to 1) proceed with hypercoagulable profile and refer to hematology if any pos findings  2) resume xarelto at dose of 10 mg daily to reduce risk of recurrent dvt/ pe  X next 6 months 3) return at end of 6 months to regroup re maint of prophylactic dose vs d/c at that point    I had an extended discussion with the patient reviewing all relevant studies completed to date and  lasting 15 to 20 minutes of a 25 minute visit    Each maintenance medication was reviewed in detail including most importantly the difference between maintenance and prns and under what circumstances the prns are to be triggered using an action plan format that is not reflected in the computer generated alphabetically organized AVS.     Please see AVS for specific instructions unique to this visit that I personally wrote and verbalized to the the pt in detail and then reviewed with pt  by my nurse highlighting any  changes in therapy recommended at today's visit to their plan of care.

## 2018-04-07 ENCOUNTER — Other Ambulatory Visit: Payer: Self-pay | Admitting: Internal Medicine

## 2018-04-07 DIAGNOSIS — I2782 Chronic pulmonary embolism: Secondary | ICD-10-CM

## 2018-04-07 LAB — HYPERCOAGULABLE PANEL, COMPREHENSIVE
ANTICARDIOLIPIN AB, IGM: 12 [MPL'U]
APTT: 29.5 s
AT III ACT/NOR PPP CHRO: 95 %
Act. Prt C Resist w/FV Defic.: 2.7 ratio
Beta-2 Glycoprotein I, IgA: <10 SAU
Beta-2 Glycoprotein I, IgG: <10 SGU
DRVVT Confirm Seconds: 32.2 s
DRVVT Ratio: 1.4 ratio — ABNORMAL HIGH
DRVVT Screen Seconds: 48.4 s — ABNORMAL HIGH
Factor VII Antigen**: 128 %
Factor VIII Activity: 89 %
HEXAGONAL PHOSPHOLIPID NEUTRAL: 0 s
HOMOCYSTEINE: 14.4 umol/L
PROT C AG ACT/NOR PPP IMM: 103 %
PROTEIN C AG/FVII AG RATIO: 0.8 ratio
Prot S Ag Act/Nor PPP Imm: 145 %
Protein S Ag/FVII Ag Ratio**: 1.1 ratio

## 2018-04-07 NOTE — Progress Notes (Signed)
Spoke with Keith Austin and notified of results per Dr. Wert. Keith Austin verbalized understanding and denied any questions. 

## 2018-04-07 NOTE — Progress Notes (Signed)
Spoke with pt and notified of results per Dr. Wert. Pt verbalized understanding and denied any questions. 

## 2018-04-14 ENCOUNTER — Encounter: Payer: Self-pay | Admitting: Hematology

## 2018-05-12 NOTE — Progress Notes (Signed)
HEMATOLOGY/ONCOLOGY CONSULTATION NOTE  Date of Service: 05/13/2018  Patient Care Team: Deeann Saint, MD as PCP - General (Family Medicine)  CHIEF COMPLAINTS/PURPOSE OF CONSULTATION:  Pulmonary Embolism  HISTORY OF PRESENTING ILLNESS:   Keith Austin is a wonderful 48 y.o. male who has been referred to [place] by Dr. Abbe Amsterdam for evaluation and management of Pulmonary embolism. The pt reports that he is doing well overall.   Prior to today's visit, the pt had a lumbar laminectomy on 04/10/17 and subsequently developed chest pain and SOB for two days before presenting to the ED on 04/19/17. He was evaluated with a CT Chest which confirmed bilateral pulmonary emboli. He also was found to have a DVT in the right peroneal vein. The pt has been taking 20mg  Xarelto, and follow up CT and [place] in August 2019 revealed no further evidence of PE nor DVT.   The pt then pursued follow up with his pulmonologist Dr. Sandrea Hughs, who ordered a Hypercoagulable panel a month ago, which demonstrated at heterozygous Prothrombin gene mutation. The pt presents for recommendation of blood thinner dose and timeline.  The pt reports that he is having some SOB, and endorses tightness on the right side of his chest when he takes a deep breath. The pt notes that his pulmonologist has explained this to be related to his pulmonary infarction related to the aforementioned PE. The pt notes that he did not notice leg swelling at the time of the event. However, currently he does feel pain in his right leg when he stands for 5 minutes, which has been explained to him as possibly being from a pinched nerve, as his follow up US Venous in August 2019 did not reveal evidence of a clot. He denies any concerns for bleeding since being on blood thinners.   The pt denies clots prior to this, and denies any family blood clots. The pt does not have biological children. He has had a shoulder surgery about 8 years ago without blood  clots. The pt notes that he was not sent home with blood thinners from his December 2018 back surgery. He notes some concern for his SCDs being removed prematurely when he was recovering from his back surgery. He developed his significant chest pain 3 days after his discharge.  Most recent lab results (04/22/17) of CBC and BMP is as follows: all values are WNL except for RBC at 3.87, HGB at 12.0, HCT at 36.8, Chloride at 99, Glucose at 106, Creatinine at 1.39, GFR at 59.  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   On review of systems, pt reports stable weight, persisting back pain, right leg pain, right chest tightness with deep breaths, moving his bowels well, and denies fevers, chills, night sweats, abdominal pains, leg swelling, painful calves, and any other symptoms.   On PMHx the pt reports PE and DVT in December 2018, lumbar laminectomy in December 2018, shoulder surgery in 2010. On Social Hx the pt denies ever smoking cigarettes.  On Family Hx the pt denies blood clots  MEDICAL HISTORY:  Past Medical History:  Diagnosis Date  . Back pain    LUMBAR   . Essential hypertension 03/30/2018  . Psoriasis    PRESENTLY EXPERIENCING   . Sciatica   . Sinus bradycardia by electrocardiogram SEE 2013 EOG EPIC    SURGICAL HISTORY: Past Surgical History:  Procedure Laterality Date  . LUMBAR LAMINECTOMY/DECOMPRESSION MICRODISCECTOMY Right 04/10/2017   Procedure: Microlumbar Decompression L5-S1 right; bilateral foraminotomy;  Surgeon: Jene EveryBeane, Jeffrey, MD;  Location: [facility];  Service: Orthopedics;  Laterality: Right;  90 mins  . SHOULDER SURGERY Left 5-6- YEARS AGO   "SHAVE AND A TORN BICEP "     SOCIAL HISTORY: Social History   Socioeconomic History  . Marital status: Married    Spouse name: Not on file  . Number of children: Not on file  . Years of education: Not on file  . Highest education level: Not on file  Occupational History  . Not on file  Social Needs    . Financial resource strain: Not on file  . Food insecurity:    Worry: Not on file    Inability: Not on file  . Transportation needs:    Medical: Not on file    Non-medical: Not on file  Tobacco Use  . Smoking status: Never Smoker  . Smokeless tobacco: Never Used  Substance and Sexual Activity  . Alcohol use: No  . Drug use: Not on file  . Sexual activity: Not on file  Lifestyle  . Physical activity:    Days per week: Not on file    Minutes per session: Not on file  . Stress: Not on file  Relationships  . Social connections:    Talks on phone: Not on file    Gets together: Not on file    Attends religious service: Not on file    Active member of club or organization: Not on file    Attends meetings of clubs or organizations: Not on file    Relationship status: Not on file  . Intimate partner violence:    Fear of current or ex partner: Not on file    Emotionally abused: Not on file    Physically abused: Not on file    Forced sexual activity: Not on file  Other Topics Concern  . Not on file  Social History Narrative  . Not on file    FAMILY HISTORY: History reviewed. No pertinent family history.  ALLERGIES:  has No Known Allergies.  MEDICATIONS:  Current Outpatient Medications  Medication Sig Dispense Refill  . gabapentin (NEURONTIN) 300 MG capsule Take 1 capsule by mouth 3 (three) times daily as needed.    . rivaroxaban (XARELTO) 20 MG TABS tablet Take 20 mg by mouth daily with supper.    Marland Kitchen. acetaminophen (TYLENOL) 500 MG tablet 1 every 6 hours as needed     No current facility-administered medications for this visit.     REVIEW OF SYSTEMS:    10 Point review of Systems was done is negative except as noted above.  PHYSICAL EXAMINATION:  . Vitals:   05/13/18 1011  BP: (!) 140/103  Pulse: 71  Resp: 18  Temp: 97.6 F (36.4 C)  SpO2: 100%   Filed Weights   05/13/18 1011  Weight: 255 lb 6.4 oz (115.8 kg)   .Body mass index is 34.64  kg/m.  GENERAL:alert, in no acute distress and comfortable SKIN: no acute rashes, no significant lesions EYES: conjunctiva are pink and non-injected, sclera anicteric OROPHARYNX: MMM, no exudates, no oropharyngeal erythema or ulceration NECK: supple, no JVD LYMPH:  no palpable lymphadenopathy in the cervical, axillary or inguinal regions LUNGS: clear to auscultation b/l with normal respiratory effort HEART: regular rate & rhythm ABDOMEN:  normoactive bowel sounds , non tender, not distended. Extremity: no pedal edema PSYCH: alert & oriented x 3 with fluent speech NEURO: no focal motor/sensory deficits  LABORATORY DATA:  I have reviewed the data as  listed  . CBC Latest Ref Rng & Units 04/22/2017 04/21/2017 04/20/2017  WBC 4.0 - 10.5 K/uL 9.8 12.4(H) 14.4(H)  Hemoglobin 13.0 - 17.0 g/dL 12.0(L) 11.8(L) 12.6(L)  Hematocrit 39.0 - 52.0 % 36.8(L) 36.9(L) 38.8(L)  Platelets 150 - 400 K/uL 263 229 236    . CMP Latest Ref Rng & Units 04/22/2017 04/21/2017 04/19/2017  Glucose 65 - 99 mg/dL 397(Q) 734(L) 937(T)  BUN 6 - 20 mg/dL 11 10 11   Creatinine 0.61 - 1.24 mg/dL 0.24(O) 9.73(Z) 3.29(J)  Sodium 135 - 145 mmol/L 136 136 133(L)  Potassium 3.5 - 5.1 mmol/L 4.2 4.6 4.5  Chloride 101 - 111 mmol/L 99(L) 100(L) 99(L)  CO2 22 - 32 mmol/L 30 30 27   Calcium 8.9 - 10.3 mg/dL 9.0 2.4(Q) 6.8(T)  Total Protein 6.0 - 8.3 g/dL - - -  Total Bilirubin 0.2 - 1.2 mg/dL - - -  Alkaline Phos 39 - 117 U/L - - -  AST 0 - 37 U/L - - -  ALT 0 - 53 U/L - - -   03/28/18 Prothrombin gene mutation Study:    RADIOGRAPHIC STUDIES: I have personally reviewed the radiological images as listed and agreed with the findings in the report. No results found.  ASSESSMENT & PLAN:   48 y.o. male with  1. Bilateral Pulmonary Embolism with DVT in right peroneal vein PLAN -Reviewed the 04/19/17 CT Chest which revealed 1. Bilateral pulmonary embolus, at the right pulmonary artery, and at segmental branches of  the left pulmonary artery. Pulmonary embolus extends into all lobes of both lungs. CT evidence of right heart strain (RV/LV Ratio = 1.3) consistent with at least submassive (intermediate risk) PE. 2. Small areas of pulmonary infarct within the right lower lung lobe. Patchy opacity at the left lower lobe is thought to reflect atelectasis. -Reviewed the 04/20/17 VAS [place] BLE which revealed Right: There is evidence of acute DVT in the Peroneal vein. No cystic structure found in the popliteal fossa. Left: There is no evidence of deep vein thrombosis in the lower extremity. No cystic structure found in the popliteal fossa. -Reviewed the 12/12/17 CT Chest which revealed no evidence of pulmonary embolism and a small right pleural effusion -Reviewed the 8/2/119 VAS [place] BLE which revealed Right: No evidence of deep vein thrombosis in the lower extremity. No indirect evidence of obstruction proximal to the inguinal ligament. Findings appear improved from previous examination. No cystic structure found in the popliteal fossa. Left: No evidence of common femoral vein obstruction. -The pt has no previous history of unprovoked blood clots, nor clots in general. Pt has never smoked cigarettes.  -Discussed that the pt does have an obvious triggering event of back surgery prior to developing his DVT and bilateral PE, however the extent of his bilateral PE was concerning for further contributing risk factors -Discussed the findings of the 03/28/18 Hypercoagulable panel which was significant for a Heterozygous Prothrombin gene mutation, which does place the pt at increased risk of developing blood clots. Provided supplemental information as well -Pt has completed a little more than one year of 20mg  Xarelto, which was appropriate given the burden of the clots -Discussed that I recommend that the pt remain on blood thinners long term, unless the patient's bleeding profile changes, as his Prothrombin gene mutation represents an  ongoing risk of blood clots -Recommend either continuing full dose 20mg  Xarelto vs preventative dose 10mg  Xarelto, and generally noted the associated risks of bleeding with both and the associated protection offered by both.  -  Advised that the pt not cross his legs, stay well hydrated, and use compression socks while on long distance travel. Also recommend intermittent ambulation on long distance travel and avoiding caffeine and alcohol consumption on long distance travel. -Advised that pt stay as close to ideal body weight as is reasonable  -Will be happy to see this pt back as needed   RTC with DR Candise CheKale as needed   All of the patients questions were answered with apparent satisfaction. The patient knows to call the clinic with any problems, questions or concerns.  The total time spent in the appt was 60 minutes and more than 50% was on counseling and direct patient cares.    Wyvonnia LoraGautam Aaryana Betke MD MS AAHIVMS [facility] Hematology/Oncology Physician [facility]  (Office):       [phone] (Work cell):  [phone] (Fax):           [phone]  05/13/2018 11:03 AM  I, Marcelline MatesSchuyler Bain, am acting as a scribe for Dr. Wyvonnia LoraGautam Dailen Mcclish.   .I have reviewed the above documentation for accuracy and completeness, and I agree with the above. Johney Maine.Jarick Harkins Kishore Kento Gossman MD

## 2018-05-13 ENCOUNTER — Telehealth: Payer: Self-pay | Admitting: Hematology

## 2018-05-13 ENCOUNTER — Encounter: Payer: Self-pay | Admitting: Hematology

## 2018-05-13 ENCOUNTER — Inpatient Hospital Stay: Payer: Self-pay | Attending: Hematology | Admitting: Hematology

## 2018-05-13 VITALS — BP 140/103 | HR 71 | Temp 97.6°F | Resp 18 | Ht 72.0 in | Wt 255.4 lb

## 2018-05-13 DIAGNOSIS — I10 Essential (primary) hypertension: Secondary | ICD-10-CM | POA: Insufficient documentation

## 2018-05-13 DIAGNOSIS — Z86718 Personal history of other venous thrombosis and embolism: Secondary | ICD-10-CM | POA: Insufficient documentation

## 2018-05-13 DIAGNOSIS — Z79899 Other long term (current) drug therapy: Secondary | ICD-10-CM | POA: Insufficient documentation

## 2018-05-13 DIAGNOSIS — Z7901 Long term (current) use of anticoagulants: Secondary | ICD-10-CM | POA: Insufficient documentation

## 2018-05-13 DIAGNOSIS — D6859 Other primary thrombophilia: Secondary | ICD-10-CM

## 2018-05-13 DIAGNOSIS — D6852 Prothrombin gene mutation: Secondary | ICD-10-CM

## 2018-05-13 DIAGNOSIS — I2699 Other pulmonary embolism without acute cor pulmonale: Secondary | ICD-10-CM | POA: Insufficient documentation

## 2018-05-13 NOTE — Patient Instructions (Signed)
Thank you for choosing [facility] to provide your oncology and hematology care.  To afford each patient quality time with our providers, please arrive 30 minutes before your scheduled appointment time.  If you arrive late for your appointment, you may be asked to reschedule.  We strive to give you quality time with our providers, and arriving late affects you and other patients whose appointments are after yours.    If you are a no show for multiple scheduled visits, you may be dismissed from the clinic at the providers discretion.     Again, thank you for choosing [facility], our hope is that these requests will decrease the amount of time that you wait before being seen by our physicians.  ______________________________________________________________________   Should you have questions after your visit to the [facility], please contact our office at [phone] between the hours of 8:30 and 4:30 p.m.    Voicemails left after 4:30p.m will not be returned until the following business day.     For prescription refill requests, please have your pharmacy contact us directly.  Please also try to allow 48 hours for prescription requests.     Please contact the scheduling department for questions regarding scheduling.  For scheduling of procedures such as PET scans, CT scans, MRI, Ultrasound, etc please contact central scheduling at [phone].     Resources For Cancer Patients and Caregivers:    Oncolink.org:  A wonderful resource for patients and healthcare providers for information regarding your disease, ways to tract your treatment, what to expect, etc.      American Cancer Society:  800-227-2345  Can help patients locate various types of support and financial assistance   Cancer Care: 1-800-813-HOPE (4673) Provides financial assistance, online support groups, medication/co-pay assistance.     [place] DSS:  336-641-3447 Where to apply  for food stamps, Medicaid, and utility assistance   Medicare Rights Center: 800-333-4114 Helps people with Medicare understand their rights and benefits, navigate the Medicare system, and secure the quality healthcare they deserve   SCAT: 336-333-6589 [place] [employer]'s shared-ride transportation service for eligible riders who have a disability that prevents them from riding the fixed route bus.     For additional information on assistance programs please contact our social worker:   Abigail Elmore:  [phone]  

## 2018-05-13 NOTE — Telephone Encounter (Signed)
Printed avs.  Per 01/21 los RTC with Dr Candise Che as needed.

## 2018-09-03 ENCOUNTER — Ambulatory Visit: Payer: Self-pay | Admitting: Internal Medicine

## 2018-09-29 ENCOUNTER — Ambulatory Visit: Payer: Self-pay | Admitting: Internal Medicine

## 2018-11-03 ENCOUNTER — Ambulatory Visit: Payer: Self-pay | Admitting: Internal Medicine

## 2019-07-29 ENCOUNTER — Ambulatory Visit: Payer: HRSA Program | Attending: Internal Medicine

## 2019-07-29 DIAGNOSIS — Z20822 Contact with and (suspected) exposure to covid-19: Secondary | ICD-10-CM | POA: Insufficient documentation

## 2019-07-30 LAB — SARS-COV-2, NAA 2 DAY TAT

## 2019-07-30 LAB — NOVEL CORONAVIRUS, NAA: SARS-CoV-2, NAA: NOT DETECTED

## 2019-12-03 IMAGING — CT CT ANGIO CHEST
2 of 7 series · 17 of 46 positions shown · IV contrast (ISOVUE 370)
Comparison: Chest radiograph performed 04/19/2017

CLINICAL DATA: Acute onset of generalized chest pain and shortness
of breath. Recent back surgery.

EXAM:
CT ANGIOGRAPHY CHEST WITH CONTRAST
TECHNIQUE: Multidetector CT imaging of the chest was performed using the
standard protocol during bolus administration of intravenous
contrast. Multiplanar CT image reconstructions and MIPs were
obtained to evaluate the vascular anatomy.
CONTRAST:  100mL 5ID0I9-QV6 IOPAMIDOL (5ID0I9-QV6) INJECTION 76%

[Series 5: thins · axial · 0.91mm/px · z∈[-320,-73]mm · 15 of 271 slices shown]
[im 12/271  lung]
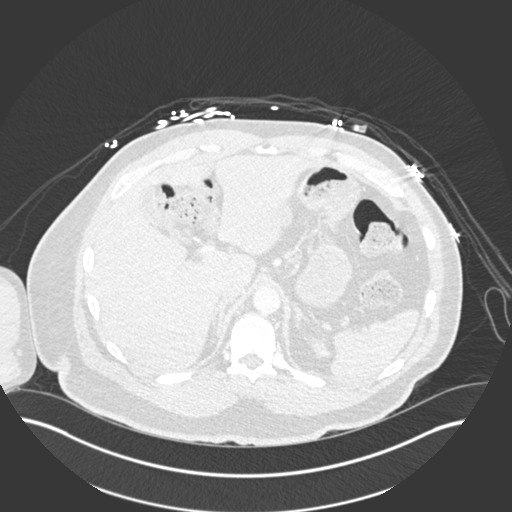
[im 36/271  soft-tissue]
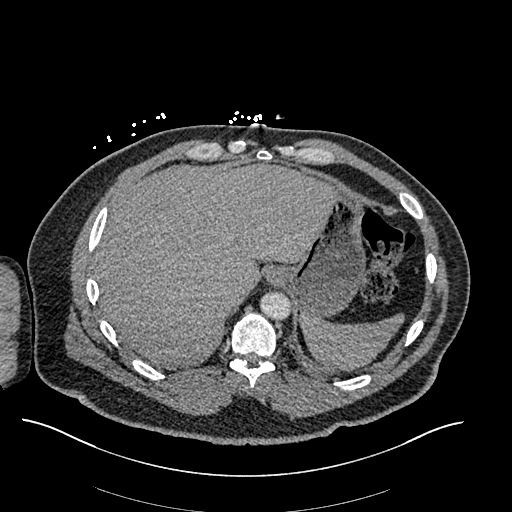
[im 47/271  lung]
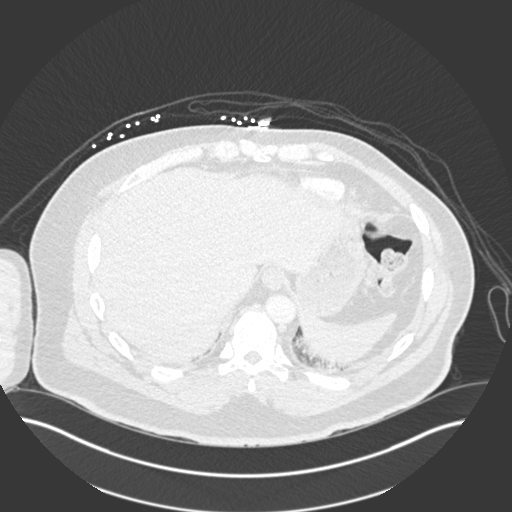
[im 71/271  soft-tissue]
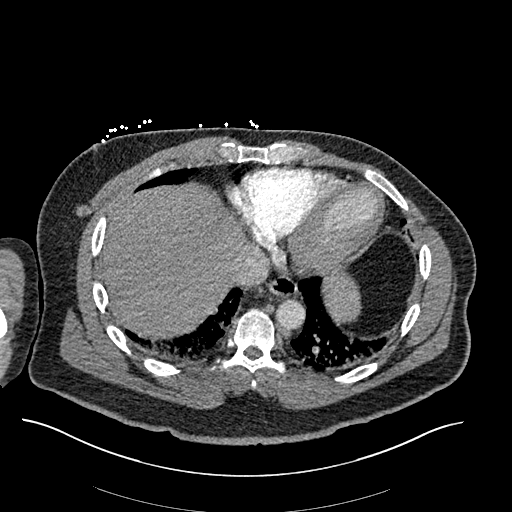
[im 83/271  lung]
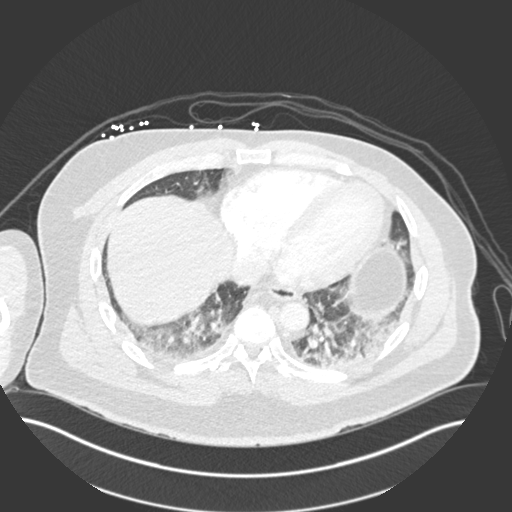
[im 106/271  soft-tissue]
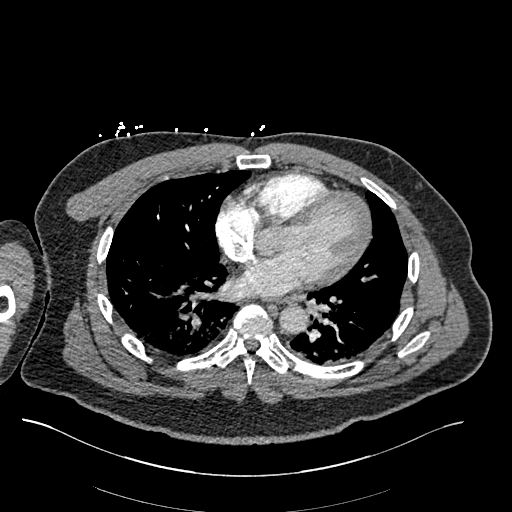
[im 118/271  lung]
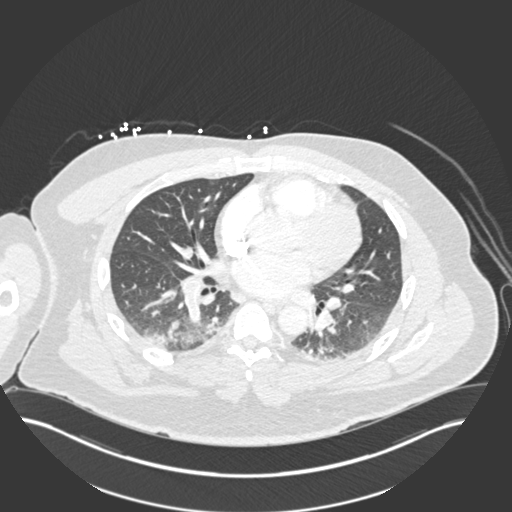
[im 141/271  soft-tissue]
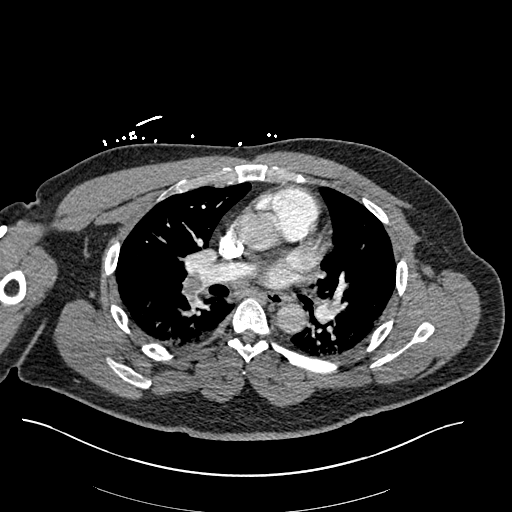
[im 153/271  lung]
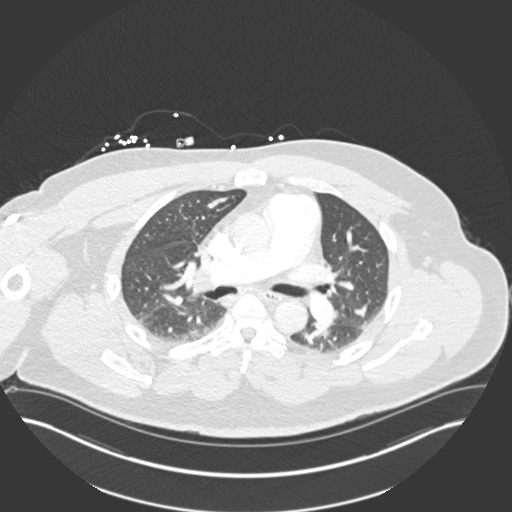
[im 165/271  soft-tissue]
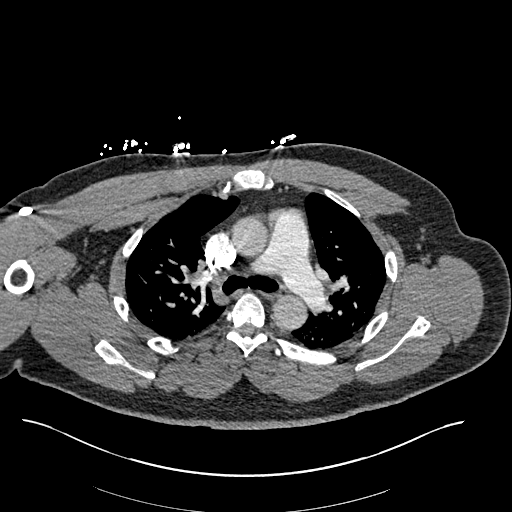
[im 188/271  lung]
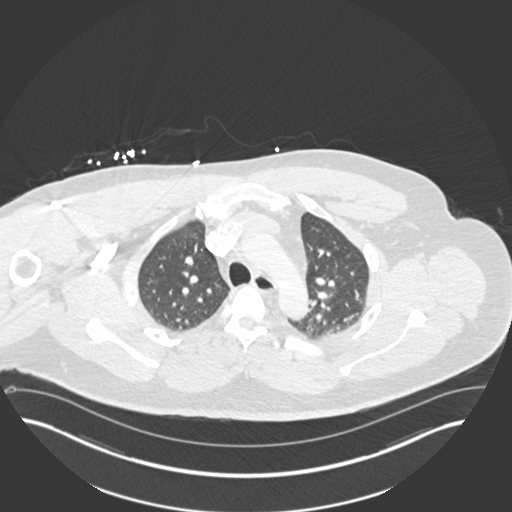
[im 200/271  soft-tissue]
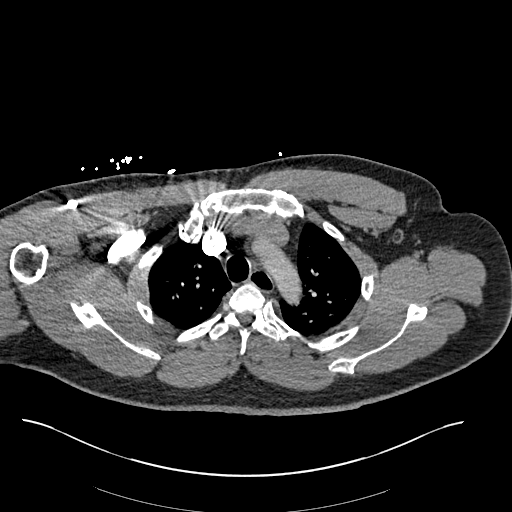
[im 224/271  lung]
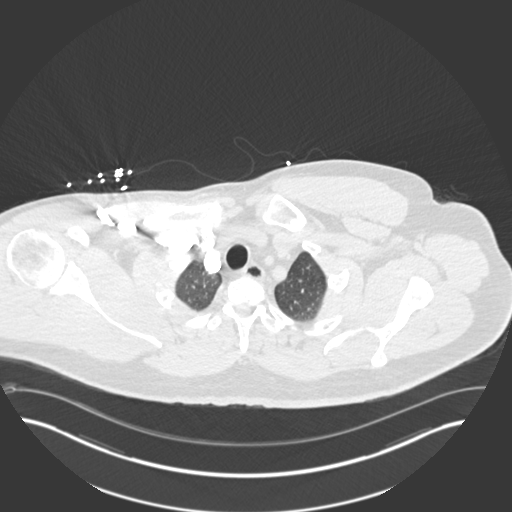
[im 235/271  soft-tissue]
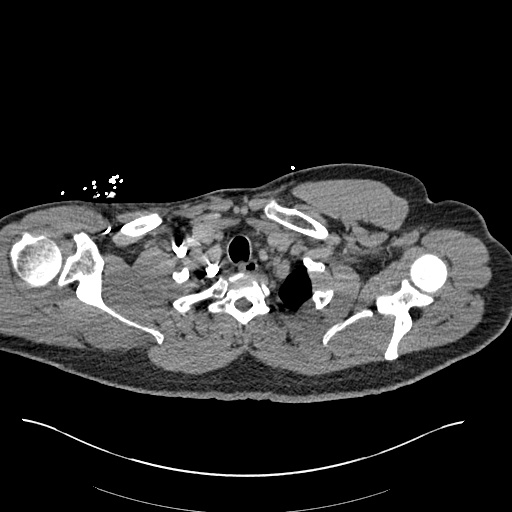
[im 259/271  lung]
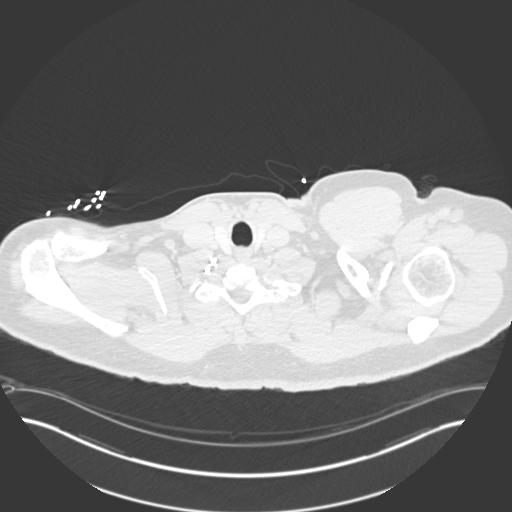

[Series 7: coronal mpr · coronal · 0.54mm/px · 2 of 86 slices shown]
[im 29/86  soft-tissue]
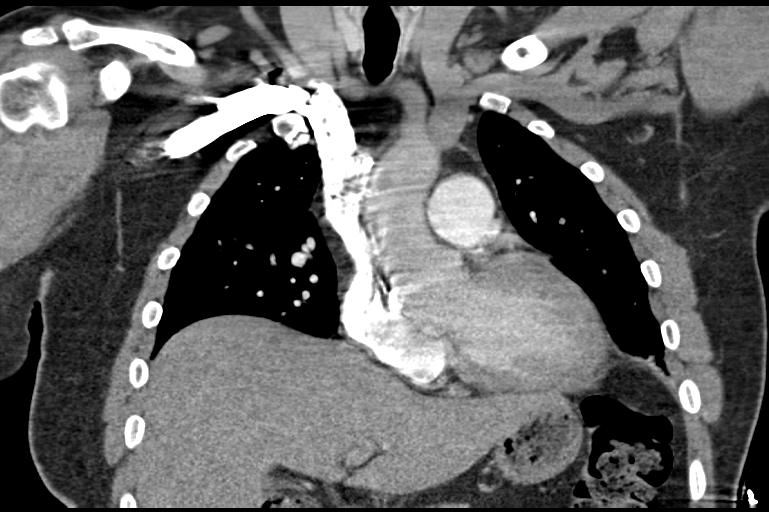
[im 57/86  soft-tissue]
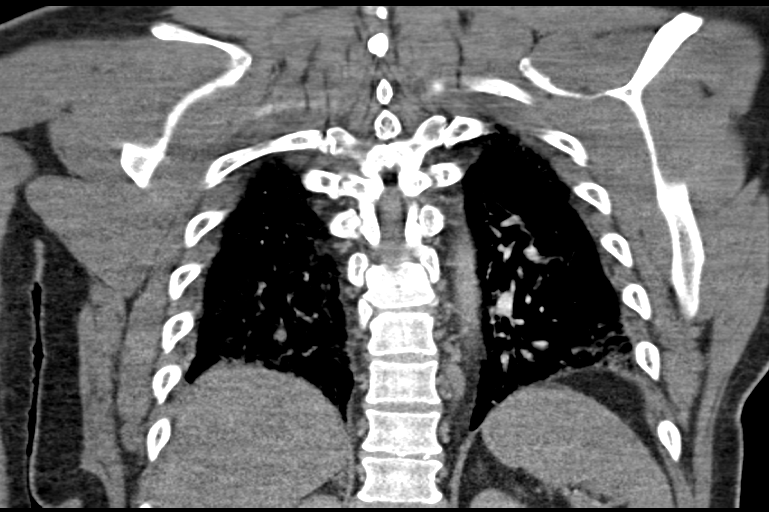

[17 of 46 positions shown; findings below may reference images not displayed]

FINDINGS: Cardiovascular: Bilateral pulmonary embolus is noted, at the right
pulmonary artery, and at segmental branches of the left pulmonary
artery. Pulmonary embolus extends into all lobes of both lungs. The
RV/LV ratio of 1.3 corresponds to right heart strain and at least
submassive pulmonary embolus.

The heart is borderline normal in size. The thoracic aorta is
unremarkable. The great vessels are grossly unremarkable in
appearance.

Mediastinum/Nodes: The mediastinum is unremarkable in appearance. No
mediastinal lymphadenopathy is seen. No pericardial effusion is
identified. The visualized portions of the thyroid gland are
unremarkable. No axillary lymphadenopathy is appreciated.

Lungs/Pleura: Small areas of pulmonary infarct are noted within the
right lower lobe. Patchy opacity at the left lower lobe is thought
to reflect atelectasis. No pleural effusion or pneumothorax is seen.
No masses are identified.

Upper Abdomen: The visualized portions of the liver and spleen are
unremarkable.

Musculoskeletal: No acute osseous abnormalities are identified. The
visualized musculature is unremarkable in appearance.

Review of the MIP images confirms the above findings.
IMPRESSION: 1. Bilateral pulmonary embolus, at the right pulmonary artery, and
at segmental branches of the left pulmonary artery. Pulmonary
embolus extends into all lobes of both lungs. CT evidence of right
heart strain (RV/LV Ratio = 1.3) consistent with at least submassive
(intermediate risk) PE. The presence of right heart strain has been
associated with an increased risk of morbidity and mortality. Please
activate Code PE by paging [phone].
2. Small areas of pulmonary infarct within the right lower lung
lobe. Patchy opacity at the left lower lobe is thought to reflect
atelectasis.

Critical Value/emergent results were called by telephone at the time
of interpretation on 04/20/2017 at [DATE] to Dr. ANTONIO RICARDO UTIEL, who
verbally acknowledged these results.

## 2019-12-14 ENCOUNTER — Other Ambulatory Visit: Payer: Self-pay

## 2019-12-14 ENCOUNTER — Ambulatory Visit (INDEPENDENT_AMBULATORY_CARE_PROVIDER_SITE_OTHER): Payer: Self-pay | Admitting: Family Medicine

## 2019-12-14 ENCOUNTER — Encounter: Payer: Self-pay | Admitting: Family Medicine

## 2019-12-14 VITALS — BP 142/98 | Ht 72.0 in | Wt 252.0 lb

## 2019-12-14 DIAGNOSIS — M5416 Radiculopathy, lumbar region: Secondary | ICD-10-CM

## 2019-12-14 MED ORDER — DICLOFENAC SODIUM 75 MG PO TBEC: 75.0000 mg | DELAYED_RELEASE_TABLET | Freq: Two times a day (BID) | ORAL | 1 refills | Status: AC

## 2019-12-14 NOTE — Patient Instructions (Signed)
I'm concerned you have lower lumber spinal stenosis. Ok to take tylenol for baseline pain relief (1-2 extra strength tabs 3x/day) Voltaren 75mg  twice a day with food for pain and inflammation. Don't take aleve or ibuprofen while on voltaren. Ok to take your methocarbamol as needed. Stay as active as possible. Call as soon as the cone coverage goes through so we can go ahead with an MRI of your lumbar spine. Follow up with me after the MRI for a no charge visit to go over next steps.

## 2019-12-14 NOTE — Progress Notes (Addendum)
PCP: Deeann Saint, MD  Subjective:   HPI: Patient is a 49 y.o. male with a past medical history of lumbar stenosis s/p lumbar decompression in 2018 with postop complications including bilateral PE who is here for right > left leg pain.  Mr. Landstrom states that in 2018, he was working at a very active job that required a lot of bending over and lifting of heavy objects and walking approximately 2 to 3 miles per day. He was working this job for several years without any difficulty until he developed rather quick onset lumbar back pain. Soon after he developed back pain, he noticed pain in his right leg that was shooting down. MRI in September 2018 showed severe right and moderate left foraminal stenosis at L5-S1. He followed up with Dr. Shelle Iron at [facility] who performed lumbar decompression in December 2018. Patient declines any significant improvement postsurgically but notes that his pain is not as severe as the initial onset. He has been consistently going to physical therapy since 2018 but has been frustrated with the continued pain in his right leg. Approximately 2 weeks ago he had a job at [employer] that required a lot of walking and lifting of approximately 15 pound objects. His back pain was significantly exacerbated by this in addition to exacerbation of his right leg pain.  At this time, he states his right back pain starts in the right hip/paraspinal area and rotates around the side of his thigh along to the anterior shin down to his right big toe. The pain is described as sharp and shooting with numbness in his lower leg and toe. He denies any muscle weakness associated with this. He is also concerned that lately he has been having symptoms in his left leg that include hip pain that wraps around to the anterior thigh down to the lower leg. This pain is also sharp and shooting with some associated numbness in his posterior calf, down to his heel. He denies any weakness with this  either.  For alleviation of pain, he has been using ibuprofen as needed. Previously Dr. Shelle Iron had prescribed prednisone but after his wife had a negative experience with it, he was hesitant to take it. In addition, he has been using a hospital bed he has available at home to sleep in a modified fetal position that improves his pain.   Past Medical History:  Diagnosis Date  . Back pain    LUMBAR   . Essential hypertension 03/30/2018  . Psoriasis    PRESENTLY EXPERIENCING   . Sciatica   . Sinus bradycardia by electrocardiogram SEE 2013 EOG EPIC    Current Outpatient Medications on File Prior to Visit  Medication Sig Dispense Refill  . acetaminophen (TYLENOL) 500 MG tablet 1 every 6 hours as needed (Patient not taking: Reported on 12/14/2019)    . gabapentin (NEURONTIN) 300 MG capsule Take 1 capsule by mouth 3 (three) times daily as needed. (Patient not taking: Reported on 12/14/2019)    . rivaroxaban (XARELTO) 20 MG TABS tablet Take 20 mg by mouth daily with supper. (Patient not taking: Reported on 12/14/2019)     No current facility-administered medications on file prior to visit.    Past Surgical History:  Procedure Laterality Date  . LUMBAR LAMINECTOMY/DECOMPRESSION MICRODISCECTOMY Right 04/10/2017   Procedure: Microlumbar Decompression L5-S1 right; bilateral foraminotomy;  Surgeon: Jene Every, MD;  Location: [facility];  Service: Orthopedics;  Laterality: Right;  90 mins  . SHOULDER SURGERY Left 5-6- YEARS AGO   "  SHAVE AND A TORN BICEP "     No Known Allergies  Social History   Socioeconomic History  . Marital status: Married    Spouse name: Not on file  . Number of children: Not on file  . Years of education: Not on file  . Highest education level: Not on file  Occupational History  . Not on file  Tobacco Use  . Smoking status: Never Smoker  . Smokeless tobacco: Never Used  Substance and Sexual Activity  . Alcohol use: No  . Drug use: Not on file  . Sexual activity:  Not on file  Other Topics Concern  . Not on file  Social History Narrative  . Not on file   Social Determinants of Health   Financial Resource Strain:   . Difficulty of Paying Living Expenses: Not on file  Food Insecurity:   . Worried About Programme researcher, broadcasting/film/video in the Last Year: Not on file  . Ran Out of Food in the Last Year: Not on file  Transportation Needs:   . Lack of Transportation (Medical): Not on file  . Lack of Transportation (Non-Medical): Not on file  Physical Activity:   . Days of Exercise per Week: Not on file  . Minutes of Exercise per Session: Not on file  Stress:   . Feeling of Stress : Not on file  Social Connections:   . Frequency of Communication with Friends and Family: Not on file  . Frequency of Social Gatherings with Friends and Family: Not on file  . Attends Religious Services: Not on file  . Active Member of Clubs or Organizations: Not on file  . Attends Banker Meetings: Not on file  . Marital Status: Not on file  Intimate Partner Violence:   . Fear of Current or Ex-Partner: Not on file  . Emotionally Abused: Not on file  . Physically Abused: Not on file  . Sexually Abused: Not on file   Past family history: 2 nieces with lupus Father with degenerative disc disease  BP (!) 142/98   Ht 6' (1.829 m)   Wt 252 lb (114.3 kg)   BMI 34.18 kg/m   Review of Systems: See HPI above.     Objective:  Physical Exam:  Gen: NAD, comfortable in exam room  Back: No gross deformity, scoliosis. No paraspinal TTP .  No midline or bony TTP. FROM. Strength LEs 5/5 all muscle groups.   2+ right patellar, trace left patellar tendon MSR.  Bilateral achilles MSRs trace, equal. Positive right SLR.  Negative left. Sensation intact to light touch bilaterally.  Right hip: No gross abnormalities on inspection. Mild tenderness to palpation along the lateral aspect. Full range of motion intact with 5 out of 5 strength with flexion and extension. No  pain with internal or external rotation. FABER test negative  Left hip: No gross abnormalities on inspection no tenderness to palpation. Full range of motion intact with 5 out of 5 strength with flexion and extension.No pain with internal or external rotation. FABER test negative  Right knee: No gross abnormalities on inspection. No tenderness to palpation. Full range of motion intact.  Left knee: No gross abnormalities on inspection. No tenderness to palpation. Full range of motion intact.   Assessment & Plan:  1. Lumbar radiculopathy: Patient's history concerning for low lumbar spinal stenosis with progression.  Initial symptoms only on right, developing on left now.  Decreased left patellar reflex.  Has done extensive physical therapy over the  past 3 years, taken ibuprofen, done home exercises and symptoms continue to progress.  Will go ahead with MRI lumbar spine to assess.  Voltaren with robaxin as needed.

## 2020-01-18 ENCOUNTER — Other Ambulatory Visit: Payer: Self-pay

## 2020-01-18 ENCOUNTER — Emergency Department (HOSPITAL_COMMUNITY)
Admission: EM | Admit: 2020-01-18 | Discharge: 2020-01-18 | Disposition: A | Discharge status: Home / Self Care | Payer: Self-pay | Attending: Emergency Medicine | Admitting: Emergency Medicine

## 2020-01-18 ENCOUNTER — Emergency Department (HOSPITAL_COMMUNITY): Payer: Self-pay

## 2020-01-18 ENCOUNTER — Emergency Department (HOSPITAL_BASED_OUTPATIENT_CLINIC_OR_DEPARTMENT_OTHER): Payer: Self-pay

## 2020-01-18 DIAGNOSIS — M5432 Sciatica, left side: Secondary | ICD-10-CM | POA: Insufficient documentation

## 2020-01-18 DIAGNOSIS — M79609 Pain in unspecified limb: Secondary | ICD-10-CM

## 2020-01-18 DIAGNOSIS — I10 Essential (primary) hypertension: Secondary | ICD-10-CM | POA: Insufficient documentation

## 2020-01-18 MED ORDER — HYDROMORPHONE HCL 1 MG/ML IJ SOLN
2.0000 mg | Freq: Once | INTRAMUSCULAR | Status: AC
  Administered 2020-01-18: 11:00:00 2 mg via INTRAMUSCULAR
  Filled 2020-01-18: qty 2

## 2020-01-18 MED ORDER — HYDROCODONE-ACETAMINOPHEN 5-325 MG PO TABS: 1.0000 | ORAL_TABLET | ORAL | 0 refills | Status: AC | PRN

## 2020-01-18 NOTE — ED Triage Notes (Signed)
Pt brought to ED from home via [facility] with c/o stabbing left upper leg pain that started about 2 days ago. Pt reports hx of blood clot in right leg. Pt alert and oriented x4, hypertensive; denies recent injuries, shortness of breath, n/v/h.  EMS v/s: 160/102 BP 92 HR 99% room air 20 RR

## 2020-01-18 NOTE — ED Provider Notes (Signed)
[facility] EMERGENCY DEPARTMENT Provider Note   CSN: [id number] Arrival date & time: 01/18/20  1042     History Chief Complaint  Patient presents with  . Leg Pain    Keith Austin is a 49 y.o. male.  HPI 49 year old male presents with left leg pain.  He chronically has sciatica involving his right leg but not typically having left-sided symptoms.  Since yesterday evening he has been having severe left hip/buttock and thigh pain.  He states this feels different than the right-sided sciatica.  There is a little bit of numbness/tingling in his calf though the pain is isolated to his thigh.  No swelling in his leg.  However he had a pulmonary embolism a few years ago and this feels very similar.  He is not currently on anticoagulation.  He took some ibuprofen with no relief.  No bowel/bladder incontinence.  No weakness in his leg but it is so painful it is hard to walk.   Past Medical History:  Diagnosis Date  . Back pain    LUMBAR   . Essential hypertension 03/30/2018  . Psoriasis    PRESENTLY EXPERIENCING   . Sciatica   . Sinus bradycardia by electrocardiogram SEE 2013 EOG EPIC    Patient Active Problem List   Diagnosis Date Noted  . Essential hypertension 03/30/2018  . Pulmonary embolism (HCC) 04/20/2017  . S/P lumbar laminectomy 04/20/2017  . Spinal stenosis, lumbar 04/10/2017  . HNP (herniated nucleus pulposus), lumbar 04/10/2017    Past Surgical History:  Procedure Laterality Date  . LUMBAR LAMINECTOMY/DECOMPRESSION MICRODISCECTOMY Right 04/10/2017   Procedure: Microlumbar Decompression L5-S1 right; bilateral foraminotomy;  Surgeon: Jene Every, MD;  Location: [facility];  Service: Orthopedics;  Laterality: Right;  90 mins  . SHOULDER SURGERY Left 5-6- YEARS AGO   "SHAVE AND A TORN BICEP "        No family history on file.  Social History   Tobacco Use  . Smoking status: Never Smoker  . Smokeless tobacco: Never Used  Substance Use Topics   . Alcohol use: No  . Drug use: Not on file    Home Medications Prior to Admission medications   Medication Sig Start Date End Date Taking? Authorizing Provider  acetaminophen (TYLENOL) 500 MG tablet 1 every 6 hours as needed Patient not taking: Reported on 12/14/2019    [provider]  diclofenac (VOLTAREN) 75 MG EC tablet Take 1 tablet (75 mg total) by mouth 2 (two) times daily. 12/14/19   Hudnall, Azucena Fallen, MD  gabapentin (NEURONTIN) 300 MG capsule Take 1 capsule by mouth 3 (three) times daily as needed. Patient not taking: Reported on 12/14/2019    [provider]  HYDROcodone-acetaminophen (NORCO) 5-325 MG tablet Take 1 tablet by mouth every 4 (four) hours as needed for severe pain. 01/18/20   Pricilla Loveless, MD    Allergies    Patient has no known allergies.  Review of Systems   Review of Systems  Respiratory: Negative for shortness of breath.   Cardiovascular: Negative for chest pain and leg swelling.  Musculoskeletal: Positive for myalgias. Negative for back pain.  Neurological: Positive for numbness. Negative for weakness.    Physical Exam Updated Vital Signs BP (!) 115/92 (BP Location: Right Arm)   Pulse 92   Temp 97.7 F (36.5 C) (Oral)   Resp 18   Ht 6' (1.829 m)   Wt 114 kg   SpO2 97%   BMI 34.09 kg/m   Physical Exam Vitals  and nursing note reviewed.  Constitutional:      Appearance: He is well-developed. He is obese.  HENT:     Head: Normocephalic and atraumatic.     Right Ear: External ear normal.     Left Ear: External ear normal.     Nose: Nose normal.  Eyes:     General:        Right eye: No discharge.        Left eye: No discharge.  Cardiovascular:     Rate and Rhythm: Normal rate and regular rhythm.     Pulses:          Dorsalis pedis pulses are 2+ on the right side and 2+ on the left side.     Heart sounds: Normal heart sounds.  Pulmonary:     Effort: Pulmonary effort is normal.     Breath sounds: Normal breath sounds.    Abdominal:     Palpations: Abdomen is soft.     Tenderness: There is no abdominal tenderness.  Musculoskeletal:     Cervical back: Neck supple.     Comments: No tenderness, swelling or skin color change to left thigh/lower leg. Seems to be painful to range his hip but no focal tenderness  Skin:    General: Skin is warm and dry.  Neurological:     Mental Status: He is alert.     Comments: 5/5 strength in BLE, though a little limited via pain in left leg. Grossly normal sensation  Psychiatric:        Mood and Affect: Mood is not anxious.     ED Results / Procedures / Treatments   Labs (all labs ordered are listed, but only abnormal results are displayed) Labs Reviewed - No data to display  EKG None  Radiology DG Hip Unilat W or Wo Pelvis 2-3 Views Left  Result Date: 01/18/2020 CLINICAL DATA:  Left leg and hip pain. EXAM: DG HIP (WITH OR WITHOUT PELVIS) 2-3V LEFT COMPARISON:  Lumbar spine 04/05/2017. FINDINGS: No acute bony or joint abnormality identified. No evidence of fracture or dislocation. Mild degenerative changes both hips. Pelvic calcifications consistent phleboliths. IMPRESSION: Mild degenerative changes both hips. Mild degenerative changes both hips. No acute abnormality. Electronically Signed   By: Maisie Fushomas  Register   On: 01/18/2020 12:14   VAS US LOWER EXTREMITY VENOUS (DVT) ([facility] 7a-7p)  Result Date: 01/18/2020  Lower Venous DVT Study Indications: Pain.  Risk Factors: DVT Right lower extremity (history of) Comparison Study: Prior negative study done 04/20/17 Performing Technologist: Sherren Kernsandace Kanady RVS  Examination Guidelines: A complete evaluation includes B-mode imaging, spectral Doppler, color Doppler, and power Doppler as needed of all accessible portions of each vessel. Bilateral testing is considered an integral part of a complete examination. Limited examinations for reoccurring indications may be performed as noted. The reflux portion of the exam is performed  with the patient in reverse Trendelenburg.  +-----+---------------+---------+-----------+----------+--------------+ RIGHTCompressibilityPhasicitySpontaneityPropertiesThrombus Aging +-----+---------------+---------+-----------+----------+--------------+ CFV  Full           Yes      Yes                                 +-----+---------------+---------+-----------+----------+--------------+   +---------+---------------+---------+-----------+----------+--------------+ LEFT     CompressibilityPhasicitySpontaneityPropertiesThrombus Aging +---------+---------------+---------+-----------+----------+--------------+ CFV      Full           Yes      Yes                                 +---------+---------------+---------+-----------+----------+--------------+  SFJ      Full                                                        +---------+---------------+---------+-----------+----------+--------------+ FV Prox  Full                                                        +---------+---------------+---------+-----------+----------+--------------+ FV Mid   Full                                                        +---------+---------------+---------+-----------+----------+--------------+ FV DistalFull                                                        +---------+---------------+---------+-----------+----------+--------------+ PFV      Full                                                        +---------+---------------+---------+-----------+----------+--------------+ POP      Full           Yes      Yes                                 +---------+---------------+---------+-----------+----------+--------------+ PTV      Full                                                        +---------+---------------+---------+-----------+----------+--------------+ PERO     Full                                                         +---------+---------------+---------+-----------+----------+--------------+     Summary: RIGHT: - No evidence of common femoral vein obstruction.  LEFT: - There is no evidence of deep vein thrombosis in the lower extremity.  *See table(s) above for measurements and observations. Electronically signed by Fabienne Bruns MD on 01/18/2020 at 2:54:11 PM.    Final     Procedures Procedures (including critical care time)  Medications Ordered in ED Medications  HYDROmorphone (DILAUDID) injection 2 mg (2 mg Intramuscular Given 01/18/20 1128)    ED Course  I have reviewed the triage vital signs and the nursing notes.  Pertinent labs & imaging results that were available during my care of the patient  were reviewed by me and considered in my medical decision making (see chart for details).    MDM Rules/Calculators/A&P                          DVT ultrasound was ordered but is negative.  Given the pain localized in his hip and x-ray was obtained but is also negative.  I think this is his sciatica.  While he is having bilateral symptoms, he does not have weakness and his pain is controlled and there is no concerning neurologic findings on exam to suggest spinal cord emergency.  He will need an outpatient MRI but does not appear to need an emergent one now.  Discharged home with pain control and return precautions. Final Clinical Impression(s) / ED Diagnoses Final diagnoses:  Left sided sciatica    Rx / DC Orders ED Discharge Orders         Ordered    HYDROcodone-acetaminophen (NORCO) 5-325 MG tablet  Every 4 hours PRN        01/18/20 1247           Pricilla Loveless, MD 01/18/20 [phone]

## 2020-01-18 NOTE — Progress Notes (Signed)
VASCULAR LAB    Left lower extremity venous dupled has been performed.  See CV proc for preliminary results.  Messaged Dr. Criss Alvine with report  Sherren Kerns, RVT 01/18/2020, 12:13 PM

## 2020-01-18 NOTE — Discharge Instructions (Signed)
If you develop worsening, recurrent, or continued back pain, numbness or weakness in the legs, incontinence of your bowels or bladders, numbness of your buttocks, fever, abdominal pain, or any other new/concerning symptoms then return to the ER for evaluation.  

## 2020-01-20 ENCOUNTER — Other Ambulatory Visit: Payer: Self-pay

## 2020-01-20 ENCOUNTER — Ambulatory Visit
Admission: RE | Admit: 2020-01-20 | Discharge: 2020-01-20 | Disposition: A | Discharge status: Home / Self Care | Payer: Self-pay | Source: Ambulatory Visit | Attending: Family Medicine | Admitting: Family Medicine

## 2020-01-20 DIAGNOSIS — M5416 Radiculopathy, lumbar region: Secondary | ICD-10-CM

## 2020-01-21 ENCOUNTER — Telehealth: Payer: Self-pay | Admitting: Family Medicine

## 2020-01-21 MED ORDER — DIAZEPAM 5 MG PO TABS: ORAL_TABLET | ORAL | 0 refills | Status: AC

## 2020-01-21 NOTE — Telephone Encounter (Signed)
Will prescribe valium x 2 tabs.

## 2020-01-21 NOTE — Telephone Encounter (Signed)
-----   Message from [facility], LAT sent at 01/20/2020  3:46 PM EDT ----- Regarding: FW: phone message Another update on this guy.  Sorry I'm filling your inbox with messages :) ----- Message ----- From: Lizbeth Bark Sent: 01/20/2020   3:32 PM EDT To: Rutha Bouchard, LAT Subject: phone message                                  Pt wife is asking for valium to help him get through the MRI. I told her you'd check with Dr. Pearletha Forge and have that filled tomorrow. She is going to reschedule the MRI for him.

## 2020-02-07 ENCOUNTER — Other Ambulatory Visit: Payer: Self-pay

## 2020-03-22 NOTE — Addendum Note (Signed)
Addended by: Lenda Kelp on: 03/22/2020 09:30 AM   Modules accepted: Orders

## 2022-06-10 NOTE — Progress Notes (Deleted)
Cardiology Office Note:    Date:  06/10/2022   ID:  Keith Austin, DOB 1970/11/19, MRN [id number]  PCP:  No primary care provider on file.  Cardiologist:  None  Electrophysiologist:  None   Referring MD: Jaclynn Major, NP   No chief complaint on file. ***  History of Present Illness:    Keith Austin is a 52 y.o. male with a hx of hypertension, hyperlipidemia, PE who is referred by Jaclynn Major, NP for evaluation of chest pain.  Labs on 05/18/2022 noted poor for creatinine 1.51, LDL 193, A1c 5.8%, hemoglobin 14.9.  Past Medical History:  Diagnosis Date   Back pain    LUMBAR    Essential hypertension 03/30/2018   Psoriasis    PRESENTLY EXPERIENCING    Sciatica    Sinus bradycardia by electrocardiogram SEE 2013 EOG EPIC    Past Surgical History:  Procedure Laterality Date   LUMBAR LAMINECTOMY/DECOMPRESSION MICRODISCECTOMY Right 04/10/2017   Procedure: Microlumbar Decompression L5-S1 right; bilateral foraminotomy;  Surgeon: Susa Day, MD;  Location: [facility];  Service: Orthopedics;  Laterality: Right;  90 mins   SHOULDER SURGERY Left 5-6- YEARS AGO   "SHAVE AND A TORN BICEP "     Current Medications: No outpatient medications have been marked as taking for the 06/12/22 encounter (Appointment) with Donato Heinz, MD.     Allergies:   Patient has no known allergies.   Social History   Socioeconomic History   Marital status: Married    Spouse name: Not on file   Number of children: Not on file   Years of education: Not on file   Highest education level: Not on file  Occupational History   Not on file  Tobacco Use   Smoking status: Never   Smokeless tobacco: Never  Substance and Sexual Activity   Alcohol use: No   Drug use: Not on file   Sexual activity: Not on file  Other Topics Concern   Not on file  Social History Narrative   Not on file   Social Determinants of Health   Financial Resource Strain: Not on file  Food  Insecurity: Not on file  Transportation Needs: Not on file  Physical Activity: Not on file  Stress: Not on file  Social Connections: Not on file     Family History: The patient's ***family history is not on file.  ROS:   Please see the history of present illness.    *** All other systems reviewed and are negative.  EKGs/Labs/Other Studies Reviewed:    The following studies were reviewed today: ***  EKG:  EKG is *** ordered today.  The ekg ordered today demonstrates ***  Recent Labs: No results found for requested labs within last 365 days.  Recent Lipid Panel No results found for: "CHOL", "TRIG", "HDL", "CHOLHDL", "VLDL", "LDLCALC", "LDLDIRECT"  Physical Exam:    VS:  There were no vitals taken for this visit.    Wt Readings from Last 3 Encounters:  01/18/20 251 lb 5.2 oz (114 kg)  12/14/19 252 lb (114.3 kg)  05/13/18 255 lb 6.4 oz (115.8 kg)     GEN: *** Well nourished, well developed in no acute distress HEENT: Normal NECK: No JVD; No carotid bruits LYMPHATICS: No lymphadenopathy CARDIAC: ***RRR, no murmurs, rubs, gallops RESPIRATORY:  Clear to auscultation without rales, wheezing or rhonchi  ABDOMEN: Soft, non-tender, non-distended MUSCULOSKELETAL:  No edema; No deformity  SKIN: Warm and dry NEUROLOGIC:  Alert and oriented x 3 PSYCHIATRIC:  Normal affect  ASSESSMENT:    No diagnosis found. PLAN:    Chest pain:  Hypertension: On amlodipine 5 mg daily  Hyperlipidemia: LDL 193 on 05/18/2022  PE: Occurred after back surgery in 2018, underwent course of anticoagulation at that time  RTC in***  Medication Adjustments/Labs and Tests Ordered: Current medicines are reviewed at length with the patient today.  Concerns regarding medicines are outlined above.  No orders of the defined types were placed in this encounter.  No orders of the defined types were placed in this encounter.   There are no Patient Instructions on file for this visit.    Signed, Donato Heinz, MD  06/10/2022 2:50 PM    [facility]

## 2022-06-12 ENCOUNTER — Ambulatory Visit: Payer: Self-pay | Admitting: Cardiology

## 2023-02-26 ENCOUNTER — Ambulatory Visit: Payer: Self-pay | Admitting: Internal Medicine

## 2023-06-11 LAB — COLOGUARD: COLOGUARD: NEGATIVE

## 2024-02-24 NOTE — Progress Notes (Signed)
 Cardiology Clinic Note  [facility] - [address] [place] [address] Dept: [phone]  REQUESTING PHYSICIAN: No ref. provider found  REASON FOR CONSULTATION:  Chief Complaint  Patient presents with   Follow-up    3 month    This document was created using the aid of voice recognition Dragon dictation software and/or Dax Copilot AI documentation tool.   Assessment & Plan Stable Angina CAD with RCA CTO with left to right collaterals and subtotal LAD distal lesion s//p PCI of distal/apical LAD on 11/20/2022 Ischemic Cardiomyopathy, LVEF 50-55% HTN -c/w Aspirin  81mg  daily, plavix 75mg  daily, Toprol 75mg  daily, crestor 20mg  nightly. Cannot tolerate lipitor 40mg  dose due to dizziness. Had to take sublingual nitroglycerin several times since last visit. -c/w Losartan 25mg  daily daily for BP goal of <130/80 -increase Imdur to 120mg  daily due to increased exertional angina. -obtain TTE and stress echo. Patient instructed to do stress echo with all his medications on board, including metoprolol.  -RTC in 3 months or sooner if needed  The above plan was discussed with the patient in detail. He expresses understanding and agrees with the plan.  Thank you for the opportunity of assisting in the care of Keith Austin.  Please do not hesitate to call if you have any questions.  Sincerely,   Lennart RAMAN. Debora, MD Cardiology  [facility] 02/24/2024  9:17 AM    History of Present Illness Keith Austin is a very pleasant 53 year old male with PMHx of  PE in 04/20/2017 after back surgery, CAD with RCA CTO with left to right collaterals and subtotal LAD distal lesion s//p PCI of distal/apical LAD on 11/20/2022, ischemic cardiomyopathy with improving LVEF at 50-55%.  02/24/2024: He is here for follow up today. He is doing well. He had some break through angina mostly when walking inclined on treadmill and also  speaking with his mom and feeling stressed. He also notes he has been having more left shoulder and arm pain/numbness after work which he thinks may be related to the muscle. He notes some times his BP does go up to 150s systolic and he would feel angina then. He tries to take his medications at the same time in the morning. He also notes that his right leg swells up some times, but that has been happening since his back surgery in 2018.    11/22/2023: He is here for follow up today. He is doing well. Angina is mostly controlled when he takes his meds. Some fluctuations in BP readings with highs around 160-96 and low reading twice with systolic in 80s. Not taking his meds at consistent times during the day. He also notes when he doesn't take his meds, he gets angina.   07/22/2023: He is here for follow up. Angina has improved. Only had to take nitroglycerin when he was traveling and forgot his medicines. Still having cough but only at night, despite changing brilinta to plavix. Also complaining about rectal bleeding. This happened 5 years ago but it started up again recently. He went to see primary care and got Cologard which was negative. He has GI appointment coming up in May.   02/15/2023: He is here for follow up. Angina has improved. Only feels soreness in left arm and shoulder when driving for 2 hours, which may not be angina at all. He has some mild tightness when lifting or doing house chores. Minimal pitting edema in lower extremities  after driving for hours. Still short of breath about 2-3 times a week after second dose of Brilinta.   12/21/2022: He is here for follow up today. Angina has better, only has chest pains when he sneezes in the left lateral region. He does note shortness of breat about 2-3 times a day since starting on brilinta. He also notes dizziness when standing up, which bothers him quite a bit.   08/13/2022: He presents due to chest pains with exertion. 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 He had a severe episode in 2022 during which he had left jaw pain, with radiation to throat and chest tightness. After which he rested on his porch and the pain went away. Since then he has been having exertional angina. Denies leg swelling, resting chest pain, shortness of breath, syncope, PND, orthopnea.   11/12/2022: states when he doesn't take his medications, his BP is low. When he takes his medications, his resting angina goes away. Still gets angina with exertion while on medications, such as mowing the law.    CORONARY RISK FACTORS:  male gender and obesity (BMI >= 30 kg/m2)   Prior Cardiac Workup: TTE 07/26/2023: The left ventricular size is normal.  Left ventricular systolic function is low normal.  LV ejection fraction = 50-55%.  LV Global L Strain =--17.3%.  The right ventricle is mildly dilated.  The right ventricular systolic function is normal.  The left atrium is mildly dilated.  The right atrium is mildly dilated.  There is no significant valvular stenosis or regurgitation.  The aortic sinus is mildly dilated.  Aortic sinus measures 4.3cm.  The ascending aorta is normal size.  The inferior vena cava is mildly dilated with a decrease in inspiratory  collapse.  There is no pericardial effusion.  Compared to prior study on 09-04-2022, LV funvtion has improved.    EKG 12/21/2022: Sinus bradycardia, prior inferior infarct.   LHC 11/20/2022:   Successful PCI of the distal/apical LAD   Maximize medical therapy. RCA CTO could be considered if refractory angina.   Procedure(s): coronary angiography, PCI of distal LAD   Indication/Presentation: stable angina with positive stress test   Access: R-radial artery with 5/30F slender sheath and micropuncture access system. Lidocaine 1% was used for local anesthesia.   Coronary angiography: Intra-arterial verapamil was administered through the radial sheath and heparin  was given to  maintain a goal ACT of 300. Next, using an 0.035 wire, an EBU 3.0 guide catheter was guided to the ascending aorta and used for selective engagement of the left main coronary artery (EBU 3.5 guide attempted initially, but was too long). Left coronary angiography and PCI of the distal LAD was performed as detailed below. The EBU guide was then exchanged for an AL-1 diagnostic catheter over an 0.035 J-tipped wire. The AL-1 was then used to selectively engage the right coronary artery.    Findings: --LM: mild diffuse disease, up to 30% distal --LAD: Diffuse mild-moderate disease, then 99% distal/apical with TIMI II flow; D1 is a relatively small vessel with 70% proximal stenosis. Supplies dense septal and faint epicardial (via diag) collaterals to RCA.  --LCx: 30-40% lesions involving the ostium and the bifurcation of a large OM1; the OM1 supplies epicardial collaterals to the RPLA. --RCA: Diffuse moderate to borderline disease proximally, then CTO mid-vessel. There are R-R collaterals from multiple proximal marginal branches. --Aortic valve not crossed.    PCI of the distal LAD: Wired to the far  apical LAD using a Runthrough 0.014 coronary wire and EBU 3.5 guide. Initial PTCA with Sapphire 1.61mm compliant balloon. This would not not cross the more distal part of the lesion, so added 42F Telescope and exchanged for Sapphire 1.94mm compliant balloon. This successfully crossed, and serial inflations performed using the 1.0, 1.5, then 2.75mm compliant balloons. IC nitroglycerin given and subsequent angiogram demonstrated adequate target for stenting. Deployed a 2.25 x 23mm Xience DES and post-dilated with 2.25mm [place] just above nominal pressure with good result.    Post-procedural stenosis was 0% with TIMI III flow.   At the conclusion of the procedure, all equipment was removed and a radial hemostasis band was placed.    Complications: none Hemostasis: Prelude Sync band EBL: < 10 cc + waste for exchanges  and ACTs Specimens: none   Comments: Successful PCI of the apical LAD. There is a relatively long CTO from the mid-RCA to the distal bifurcation with dense ipsilateral and contralateral collaterals, most prominently from LAD septals. Maximize medical therapy, including at least 6 months of DAPT. If he were to have refractory angina, the RCA CTO could be attempted, but would likely be challenging given its length and distal cap at the PDA/PLA bifurcation.      LHC 09/11/2022:   Severe two-vessel CAD involving RCA CTO and small-vessel disease of the LAD   Normal LVEDP. No significant LV-Ao gradient.   Maximize medical therapy and consider CTO PCI of the RCA for refractory angina   --LM: long with diffuse mild disease and 30-40% distal --LAD: diffuse mild proximal disease then long 99% subtotal occlusion far distal/apical segment with possible bridging collaterals. D1 is a small-caliber branching vessel with long 70% stenosis from the ostium to bifurcation. There are septal collaterals to the RCA.  --LCx: 40% ostial otherwise minimal irregularities, terminating as a large OM1  --RCA: 50% prox just after the ostium then long CTO from proximal vessel to the distal bifurcation with bridging collaterals and septal collaterals from the LAD.  --LVEDP: 12 mmHg   TTE 09/04/2022: Image Quality  Good. A two-dimensional transthoracic echocardiogram with  color flow, Doppler and  global longitudinal strain imaging was performed.  The left ventricular size is normal.  Basal left ventricular septal hypertrophy  Left ventricular systolic function is mildly reduced.  LV ejection fraction = 40-45%.  LV Global L Strain =-15.3%.  There are regional wall motion abnormalities as specified below.  The right ventricle is normal in size and function.  The atria are normal in size.  A patent foramen ovale is suspected.  The aortic valve is normal in structure and function.  There is mild mitral regurgitation.   The aortic sinus is mildly dilated at 4.3cm.  The ascending aorta is normal size.  IVC size was mildly dilated.  There is no pericardial effusion.  No prior study for comparison.  LEFT VENTRICLE  The left ventricular size is normal. There is normal left ventricular wall  thickness. Basal left  ventricular septal hypertrophy. Left ventricular systolic function is  mildly reduced. LV ejection  fraction = 40-45%. LV Global L Strain =-15.3%. Left ventricular  longitudinal strain is borderline  reduced. There is mild diastolic dysfunction, Grade IA, with normal left  atrial pressure. There are  regional wall motion abnormalities as specified below. There is  hypokinesis of the RCA territory  involving the basal and mid segment s .    Nuclear stress test 08/21/2022: ischemia in inferior, inferolatera and inferoapical regions. EKG positive for ST  depressions in inferior leads.   EKG 08/13/2022: SR, inferior ischemia   TTE 04/20/2017 ([facility]): - LVEF 55-60%, normal wall thickness, normal wall motion, grade 1    DD, indeterminate LV filling pressure, dilated aortic root to 4.2    cm, trivial MR, normal RV size and systolic function, normal IVC.    Medical History[1]  Surgical History[2]       Family History[3]  Current Medications[4]  Allergies[5]   REVIEW OF SYSTEMS:  All other systems were reviewed and were negative in detail except as noted in the HPI.  PHYSICAL EXAMINATION: Vitals:   02/24/24 0826  BP: 116/74  Pulse: 64  SpO2: 96%   Weights (last 90 days)     Date/Time Weight   02/24/24 0826 116 kg (254 lb 12.8 oz)       General:  Resting comfortably in NAD Skin:  Warm & dry Neuro:  Alert HEENT:  Sclera anicteric.   Resp:  Resp even and nonlabored.  Lungs sounds clear throughout CV:  S1S2 regular rate and rhythm, no murmur noted, No gallops or rubs Abd:  Soft, nontender, NABS Ext:  No cyanosis or edema MSK:  Moving all extremities.   Neck:  No thyromegaly.   No JVD.  no carotid bruit(s) Pulses:  2+ and symmetrical upper and lower extremities.   Heme: No signs of bleeding or excessive bruising.  Labs/Imaging: No results found for: CKTOTAL, CKMB, CKMBINDEX   .    Lab Results  Component Value Date   AST 21 11/23/2022   ALT 21 11/23/2022   BILITOT 0.5 11/23/2022   TSH 3.187 11/23/2022   CHOL 218 (H) 05/24/2023   HDL 42 (L) 05/24/2023        [1] Past Medical History: Diagnosis Date   Back pain    Coronary artery disease    DVT (deep venous thrombosis)    (CMD) 04/20/2017   after back surgery   Hypertension    Pain of lumbar spine 12/24/2017   Positive cardiac stress test 08/28/2022   Pulmonary embolism    (CMD) 04/20/2017   Provoked event s/p back surgery 04/10/17  See CTa 04/19/17  large clot burden,likely with infarcts RLL  - See venous dopplers  04/20/18  Pos on R / neg L   - see echo 04/20/17 no RV strain  - 11/26/2017  Walked RA x 3 laps @ 185 ft each stopped due to  End of study, fast pace, no sob or desat  - Venous dopplers 12/11/17  neg   - CT chest 12/12/2017  Neg PE/ tiny R Pl effusion  - 02/2018 stopped xarelt   Pulmonary embolism, bilateral    (CMD) 04/20/2017   after back surgery   Screening for cardiovascular condition 08/13/2022   Stable angina pectoris 08/13/2022  [2] Past Surgical History: Procedure Laterality Date   BACK SURGERY     CARDIAC CATHETERIZATION Left 09/11/2022   CV CATHETERIZATION LEFT HEART performed by Darice Earnie Sharps, MD at [facility] INVASIVE LAB   CARDIAC CATHETERIZATION N/A 09/11/2022   Coronary angiography performed by Darice Earnie Sharps, MD at [facility] INVASIVE LAB   CARDIAC CATHETERIZATION Left 11/20/2022   CV CATHETERIZATION LEFT HEART performed by Glendia Tanda Ee, MD at [facility] INVASIVE LAB   CARDIAC CATHETERIZATION N/A 11/20/2022   Percutaneous coronary intervention performed by Glendia Tanda Ee, MD at [facility] INVASIVE LAB  [3] Family History Problem Relation Name Age of Onset    Hypertension Mother     Diabetes Father  Hypertension Father     Prostate cancer Father    [4] Current Outpatient Medications  Medication Sig Dispense Refill   acetaminophen  (TYLENOL ) 500 mg tablet 1 every 6 hours as needed     aspirin  81 mg EC tablet Take 1 tablet (81 mg total) by mouth daily. 90 tablet 3   clopidogreL (PLAVIX) 75 mg tablet Take 1 tablet (75 mg total) by mouth daily. 90 tablet 3   losartan (COZAAR) 25 mg tablet Take 1 tablet (25 mg total) by mouth daily. 90 tablet 3   metoprolol succinate (TOPROL XL) 25 mg 24 hr tablet Take 3 tablets (75 mg total) by mouth daily. 270 tablet 3   nitroglycerin (NITROSTAT) 0.4 mg SL tablet Place 1 tablet (0.4 mg total) under the tongue every 5 (five) minutes as needed for chest pain. 15 tablet 5   rosuvastatin (CRESTOR) 20 mg tablet Take 1 tablet (20 mg total) by mouth daily. 90 tablet 3   isosorbide mononitrate (IMDUR) 120 mg 24 hr tablet Take 1 tablet (120 mg total) by mouth daily. 90 tablet 3   No current facility-administered medications for this visit.  [5] No Known Allergies

## 2024-05-15 ENCOUNTER — Emergency Department (HOSPITAL_COMMUNITY)

## 2024-05-15 ENCOUNTER — Emergency Department (HOSPITAL_COMMUNITY)
Admission: EM | Admit: 2024-05-15 | Discharge: 2024-05-16 | Disposition: A | Attending: Emergency Medicine | Admitting: Emergency Medicine

## 2024-05-15 ENCOUNTER — Other Ambulatory Visit: Payer: Self-pay

## 2024-05-15 DIAGNOSIS — Z7901 Long term (current) use of anticoagulants: Secondary | ICD-10-CM | POA: Insufficient documentation

## 2024-05-15 DIAGNOSIS — Z7982 Long term (current) use of aspirin: Secondary | ICD-10-CM | POA: Insufficient documentation

## 2024-05-15 DIAGNOSIS — I2510 Atherosclerotic heart disease of native coronary artery without angina pectoris: Secondary | ICD-10-CM | POA: Diagnosis not present

## 2024-05-15 DIAGNOSIS — R1084 Generalized abdominal pain: Secondary | ICD-10-CM | POA: Insufficient documentation

## 2024-05-15 DIAGNOSIS — Z79899 Other long term (current) drug therapy: Secondary | ICD-10-CM | POA: Diagnosis not present

## 2024-05-15 DIAGNOSIS — I2694 Multiple subsegmental pulmonary emboli without acute cor pulmonale: Secondary | ICD-10-CM | POA: Diagnosis not present

## 2024-05-15 DIAGNOSIS — R079 Chest pain, unspecified: Secondary | ICD-10-CM | POA: Diagnosis present

## 2024-05-15 DIAGNOSIS — I10 Essential (primary) hypertension: Secondary | ICD-10-CM | POA: Diagnosis not present

## 2024-05-15 LAB — BASIC METABOLIC PANEL WITH GFR
Anion gap: 12 (ref 5–15)
BUN: 11 mg/dL (ref 6–20)
CO2: 25 mmol/L (ref 22–32)
Calcium: 9.3 mg/dL (ref 8.9–10.3)
Chloride: 104 mmol/L (ref 98–111)
Creatinine, Ser: 1.44 mg/dL — ABNORMAL HIGH (ref 0.61–1.24)
GFR, Estimated: 57 mL/min — ABNORMAL LOW
Glucose, Bld: 139 mg/dL — ABNORMAL HIGH (ref 70–99)
Potassium: 4 mmol/L (ref 3.5–5.1)
Sodium: 141 mmol/L (ref 135–145)

## 2024-05-15 LAB — CBC
HCT: 42.9 % (ref 39.0–52.0)
Hemoglobin: 14.1 g/dL (ref 13.0–17.0)
MCH: 31 pg (ref 26.0–34.0)
MCHC: 32.9 g/dL (ref 30.0–36.0)
MCV: 94.3 fL (ref 80.0–100.0)
Platelets: 215 K/uL (ref 150–400)
RBC: 4.55 MIL/uL (ref 4.22–5.81)
RDW: 13.5 % (ref 11.5–15.5)
WBC: 7.1 K/uL (ref 4.0–10.5)
nRBC: 0 % (ref 0.0–0.2)

## 2024-05-15 LAB — D-DIMER, QUANTITATIVE: D-Dimer, Quant: 1.41 ug{FEU}/mL — ABNORMAL HIGH (ref 0.00–0.50)

## 2024-05-15 LAB — LIPASE, BLOOD: Lipase: 46 U/L (ref 11–51)

## 2024-05-15 LAB — TROPONIN T, HIGH SENSITIVITY
Troponin T High Sensitivity: 12 ng/L (ref 0–19)
Troponin T High Sensitivity: 13 ng/L (ref 0–19)

## 2024-05-15 MED ORDER — NITROGLYCERIN 0.4 MG SL SUBL
0.4000 mg | SUBLINGUAL_TABLET | SUBLINGUAL | Status: DC | PRN
  Administered 2024-05-15: 0.4 mg via SUBLINGUAL
  Filled 2024-05-15: qty 1

## 2024-05-15 MED ORDER — RIVAROXABAN 15 MG PO TABS
15.0000 mg | ORAL_TABLET | Freq: Once | ORAL | Status: AC
  Administered 2024-05-15: 15 mg via ORAL
  Filled 2024-05-15: qty 1

## 2024-05-15 MED ORDER — RIVAROXABAN (XARELTO) VTE STARTER PACK (15 & 20 MG): ORAL_TABLET | ORAL | 0 refills | Status: AC

## 2024-05-15 MED ORDER — ASPIRIN 81 MG PO CHEW
324.0000 mg | CHEWABLE_TABLET | Freq: Once | ORAL | Status: AC
  Administered 2024-05-15: 324 mg via ORAL
  Filled 2024-05-15: qty 4

## 2024-05-15 MED ORDER — OXYCODONE HCL 5 MG PO TABS: 5.0000 mg | ORAL_TABLET | Freq: Four times a day (QID) | ORAL | 0 refills | Status: AC | PRN

## 2024-05-15 MED ORDER — OXYCODONE HCL 5 MG PO TABS: 5.0000 mg | ORAL_TABLET | ORAL | 0 refills | Status: DC | PRN

## 2024-05-15 MED ORDER — IOHEXOL 350 MG/ML SOLN
75.0000 mL | Freq: Once | INTRAVENOUS | Status: AC | PRN
  Administered 2024-05-15: 100 mL via INTRAVENOUS

## 2024-05-15 MED ORDER — FENTANYL CITRATE (PF) 50 MCG/ML IJ SOSY
50.0000 ug | PREFILLED_SYRINGE | Freq: Once | INTRAMUSCULAR | Status: AC
  Administered 2024-05-15: 50 ug via INTRAVENOUS
  Filled 2024-05-15: qty 1

## 2024-05-15 MED ORDER — OXYCODONE HCL 5 MG PO TABS
5.0000 mg | ORAL_TABLET | Freq: Once | ORAL | Status: AC
  Administered 2024-05-15: 5 mg via ORAL
  Filled 2024-05-15: qty 1

## 2024-05-15 NOTE — ED Triage Notes (Signed)
 Pt had sudden onset of left sided chest pressure and upper abdominal pressure accompanied by SOB approx 30 mins ago. Pain radiates into upper back and left arm is tinglingthe patient has significant cardiac history as well as history of bilateral PE's

## 2024-05-15 NOTE — Discharge Instructions (Addendum)
 Keith Austin  Thank you for allowing us  to take care of you today.  You came to the Emergency Department today because you had recurrent severe chest pain radiating into your belly.  Here in the emergency department your heart number was looking good, therefore you are not having any heart attack, however you should continue to follow-up with your cardiologist outpatient.  Unfortunately your CT did show that you have a new blood clot in your lung, therefore we are going to restart you on Xarelto .  For the first 3 weeks he will take 50 mg twice daily, and after that you will take 20 mg once daily.  We are going to give you referral to follow-up with hematology, as you may need further workup to see why you have redeveloped blood clots, sometimes people can have clotting disorders that make them more prone to getting blood clots.  You should also follow-up with your primary care doctor who in conjunction with the hematologist will determine how long you need to be on the Xarelto .  You can use Tylenol   every 4-6 hours as needed for pain control. For adults, the dosing is 500 mg of Tylenol .  Please do not exceed 4 g of Tylenol  within 24 hours.  Will prescribe you a short course of oxycodone  that you can use for breakthrough pain, please do not drive after taking this medication as it can make you sleepy.   To-Do: 1. Please follow-up with your primary doctor within 1 - 2 weeks / as soon as possible.  Please return to the Emergency Department or call 911 if you experience have worsening of your symptoms, or do not get better, new or different chest pain, shortness of breath, severe or significantly worsening pain, high fever, severe confusion, pass out or have any reason to think that you need emergency medical care.   We hope you feel better soon.   Mitzie Later, MD Department of Emergency Medicine [facility]

## 2024-05-15 NOTE — ED Provider Notes (Signed)
 " [facility] Provider Note   CSN: [id number] Arrival date & time: 05/15/24  2003     History Chief Complaint  Patient presents with   Chest Pain   Shortness of Breath     HPI: Keith Austin is a 54 y.o. male with history perinent for hypertension, prior pulmonary embolism not currently not currently on anticoagulation, spinal stenosis, CAD who presents complaining of chest pain. Patient arrived via POV accompanied by wife.  History provided by patient and spouse/partner.  No interpreter required during this encounter.  Patient presents with chest pain, shortness of breath, diffuse abdominal pain with onset just prior to arrival.  Patient reports that he has a history of prior pulmonary embolisms, and states that his symptoms are currently reminiscent of when he previously experienced pulmonary embolisms.  Reports that that pain occurred without specific precipitating factor approximately 30 minutes prior to arrival while he was visiting his daughter.  Since then there have not been any specific aggravating or alleviating factors.  Per wife, patient has had total of approximately 5 similar episodes over the past week.  Episodes have previously resolved spontaneously or with the use of nitroglycerin .  The patient has called his cardiologist, however has not gotten a callback for further advice.  Has no fever, chills, nausea, vomiting, diarrhea, does report diffuse abdominal pain, shortness of breath, chest pain.  Patient's recorded medical, surgical, social, medication list and allergies were reviewed in the Snapshot window as part of the initial history.   Prior to Admission medications  Medication Sig Start Date End Date Taking? Authorizing Provider  aspirin  EC 81 MG tablet Take 81 mg by mouth daily. 02/24/24 02/23/25 Yes [provider]  clopidogrel (PLAVIX) 75 MG tablet Take 75 mg by mouth daily. 02/24/24 02/23/25 Yes [provider]  cyanocobalamin 100 MCG tablet Take 100 mcg by mouth. 03/09/24 03/09/25 Yes [provider]  isosorbide mononitrate (IMDUR) 120 MG 24 hr tablet Take 120 mg by mouth daily. 03/03/24  Yes [provider]  isosorbide mononitrate (IMDUR) 30 MG 24 hr tablet Take 30 mg by mouth daily. 05/14/24 05/14/25 Yes [provider]  metoprolol succinate (TOPROL-XL) 25 MG 24 hr tablet Take 75 mg by mouth. 02/24/24 02/23/25 Yes [provider]  RIVAROXABAN  (XARELTO ) VTE STARTER PACK (15 & 20 MG) Take 15 mg by mouth 2 (two) times daily for 21 days, THEN 20 mg daily. Follow package directions: Take one 15mg  tablet by mouth twice a day. On day 22, switch to one 20mg  tablet once a day. Take with food. 05/15/24 07/14/24 Yes Rogelia Jerilynn RAMAN, MD  rosuvastatin (CRESTOR) 20 MG tablet Take 20 mg by mouth daily. 02/24/24 02/23/25 Yes [provider]  acetaminophen  (TYLENOL ) 500 MG tablet 1 every 6 hours as needed Patient not taking: Reported on 12/14/2019    [provider]  diazepam  (VALIUM ) 5 MG tablet Take 1 tablet 30-45 minutes prior to procedure.  May repeat x 1 if needed. 01/21/20   Hudnall, Ludie SAUNDERS, MD  diclofenac  (VOLTAREN ) 75 MG EC tablet Take 1 tablet (75 mg total) by mouth 2 (two) times daily. 12/14/19   Hudnall, Ludie SAUNDERS, MD  gabapentin  (NEURONTIN ) 300 MG capsule Take 1 capsule by mouth 3 (three) times daily as needed. Patient not taking: Reported on 12/14/2019    [provider]  HYDROcodone -acetaminophen  (NORCO) 5-325 MG tablet Take 1 tablet by mouth every 4 (four) hours as needed for severe pain. 01/18/20  Freddi Hamilton, MD  isosorbide mononitrate (IMDUR) 60 MG 24 hr tablet Take 60 mg by mouth daily.    [provider]  nitroGLYCERIN  (NITROSTAT ) 0.4 MG SL tablet Place 0.4 mg under the tongue.    [provider]  oxyCODONE  (ROXICODONE ) 5 MG immediate release tablet Take 1 tablet (5 mg total) by mouth every 6 (six) hours as  needed for up to 5 days for severe pain (pain score 7-10). 05/15/24 05/20/24  Rogelia Jerilynn RAMAN, MD     Allergies: Patient has no known allergies.   Review of Systems   ROS as per HPI  Physical Exam Updated Vital Signs BP (!) 158/105   Pulse 92   Temp 98.6 F (37 C)   Resp (!) 21   SpO2 91%  Physical Exam Vitals and nursing note reviewed.  Constitutional:      General: He is not in acute distress.    Comments: Uncomfortable appearing  HENT:     Head: Normocephalic and atraumatic.  Eyes:     Conjunctiva/sclera: Conjunctivae normal.  Cardiovascular:     Rate and Rhythm: Normal rate and regular rhythm.     Pulses:          Radial pulses are 2+ on the right side and 2+ on the left side.     Heart sounds: No murmur heard. Pulmonary:     Effort: Pulmonary effort is normal. Tachypnea present. No respiratory distress.     Breath sounds: Normal breath sounds.  Abdominal:     Palpations: Abdomen is soft.     Tenderness: There is abdominal tenderness (Mild, diffuse).  Musculoskeletal:        General: No swelling.     Cervical back: Neck supple.     Right lower leg: No tenderness. No edema.     Left lower leg: No tenderness. No edema.  Skin:    General: Skin is warm and dry.     Capillary Refill: Capillary refill takes less than 2 seconds.  Neurological:     Mental Status: He is alert.  Psychiatric:        Mood and Affect: Mood normal.     ED Course/ Medical Decision Making/ A&P    Procedures Procedures   Medications Ordered in ED Medications  nitroGLYCERIN  (NITROSTAT ) SL tablet 0.4 mg (0.4 mg Sublingual Given 05/15/24 2128)  fentaNYL  (SUBLIMAZE ) injection 50 mcg (50 mcg Intravenous Given 05/15/24 2129)  iohexol  (OMNIPAQUE ) 350 MG/ML injection 75 mL (100 mLs Intravenous Contrast Given 05/15/24 2223)  aspirin  chewable tablet 324 mg (324 mg Oral Given 05/15/24 2228)  oxyCODONE  (Oxy IR/ROXICODONE ) immediate release tablet 5 mg (5 mg Oral Given 05/15/24 2350)  Rivaroxaban   (XARELTO ) tablet 15 mg (15 mg Oral Given 05/15/24 2350)    Medical Decision Making:   Keith Austin is a 54 y.o. male who presents for diffuse abdominal pain, as per above.  Physical exam is pertinent for uncomfortable appearance, tachypnea, mild diffuse abdominal tenderness.   The differential includes but is not limited to ACS, arrhythmia, pericardial tamponade, pericarditis, myocarditis, pneumonia, pneumothorax, esophageal, tear, perforated abdominal viscous, pulmonary embolism, aortic dissection, costochondritis, musculoskeletal chest wall pain, GERD.  Independent historian: Spouse/partner  External data reviewed: Labs: reviewed prior labs for baseline  Initial Plan:  Screening labs including CBC and Metabolic panel to evaluate for infectious or metabolic etiology of disease.  Screening D-dimer given chest pain and history of pulmonary embolism Screening lipase for pancreatitis given diffuse abdominal pain Chest x-ray to evaluate for structural/infectious intra-or  Asik pathology.  EKG and serial troponin to evaluate for cardiac pathology. Objective evaluation as below reviewed   Labs: Ordered, Independent interpretation, and Details: CBC without leukocytosis, anemia, thrombocytopenia.  BMP with creatinine at baseline.  No emergent electrolyte derangement. D-dimer elevated at 1.4.  Lipase WNL..  Initial troponin WNL at 12, delta 13.  Radiology: Ordered, Independent interpretation, Details: Chest x-ray without focal airspace opacification, cardiomediastinal switcher derangement, pneumothorax, pleural effusion, bony derangement.  Reviewed CT PE study, do not appreciate large central PE.  CT abdomen pelvis without focal fat stranding, obstructive bowel gas pattern, free fluid, free air, and All images reviewed independently.  Agree with radiology report at this time.   CT ABDOMEN PELVIS W CONTRAST Result Date: 05/15/2024 EXAM: CT ABDOMEN AND PELVIS WITH CONTRAST 05/15/2024 10:23:31 PM  TECHNIQUE: CT of the abdomen and pelvis was performed with the administration of 100 mL of iohexol  (OMNIPAQUE ) 350 MG/ML injection. Multiplanar reformatted images are provided for review. Automated exposure control, iterative reconstruction, and/or weight-based adjustment of the mA/kV was utilized to reduce the radiation dose to as low as reasonably achievable. COMPARISON: Prior examinations. CLINICAL HISTORY: Abdominal pain and pressure. FINDINGS: LOWER CHEST: No acute abnormality. LIVER: The liver is homogeneous in attenuation with the exception of a rounded area of decreased attenuation in the left lobe of the liver measuring 17 mm. This is best seen on image number 13 of series 3. This is not evaluated on the delayed images. This may represent a small hemangioma or cyst. A non-emergent ultrasound would be helpful for further evaluation. This was not present on prior exams. GALLBLADDER AND BILE DUCTS: The gallbladder is partially decompressed. Pericholecystic fluid is noted. These changes may represent cholecystitis. Correlation with physical exam is recommended. No biliary ductal dilatation. SPLEEN: No acute abnormality. PANCREAS: No acute abnormality. ADRENAL GLANDS: No acute abnormality. KIDNEYS, URETERS AND BLADDER: Kidneys demonstrate a normal enhancement pattern. No renal calculi or obstructive changes are noted. A simple renal cyst is noted within the right kidney. No follow-up is recommended. The bladder is partially distended. No perinephric or periureteral stranding. GI AND BOWEL: Stomach and small bowel are within normal limits. No obstructive or inflammatory changes of the colon are noted. The appendix is not well visualized. No inflammatory changes to suggest appendicitis are noted. There is no bowel obstruction. PERITONEUM AND RETROPERITONEUM: No free fluid is seen. No free air. VASCULATURE: Mild aortic calcifications are seen without any aneurysmal dilatation. LYMPH NODES: No lymphadenopathy.  REPRODUCTIVE ORGANS: Prostate is within normal limits. BONES AND SOFT TISSUES: Bony structures are within normal limits. No focal soft tissue abnormality. IMPRESSION: 1. Gallbladder changes suggestive of cholecystitis; recommend physical examination and laboratory evaluation. Ultrasound may be helpful as well. 2. Rounded 17 mm area of decreased attenuation in the left hepatic lobe, new from prior exams, possibly a small hemangioma or cyst; recommend nonemergent ultrasound for further evaluation. Electronically signed by: Oneil Devonshire MD 05/15/2024 10:45 PM EST RP Workstation: [id number]   CT Angio Chest PE W and/or Wo Contrast Result Date: 05/15/2024 EXAM: CTA of the Chest with contrast for PE 05/15/2024 10:23:31 PM TECHNIQUE: CTA of the chest was performed without and with the administration of 100 mL of intravenous contrast (iohexol  (OMNIPAQUE ) 350 MG/ML injection 75 mL IOHEXOL  350 MG/ML SOLN). Multiplanar reformatted images are provided for review. MIP images are provided for review. Automated exposure control, iterative reconstruction, and/or weight based adjustment of the mA/kV was utilized to reduce the radiation dose to as low as reasonably achievable. COMPARISON: Plain  film from earlier in the same day and CT from 12/12/2017. CLINICAL HISTORY: Left-sided chest pain and shortness of breath. FINDINGS: PULMONARY ARTERIES: A few small subsegmental filling defects are noted within the right lower lobe medially. Main pulmonary artery is normal in caliber. MEDIASTINUM: The heart and pericardium demonstrate no acute abnormality. Thoracic aorta shows no aneurysmal dilatation or dissection. The esophagus is within normal limits. LYMPH NODES: No mediastinal, hilar or axillary lymphadenopathy. LUNGS AND PLEURA: The lungs are well aerated bilaterally. No focal infiltrate or sizable effusion is seen. No parenchymal nodules are identified. No pneumothorax. UPPER ABDOMEN: Limited images of the upper abdomen are  unremarkable. SOFT TISSUES AND BONES: Bony structures are within normal limits. No acute soft tissue abnormality. IMPRESSION: 1. A few small subsegmental filling defects within the right lower lobe medially, consistent with pulmonary emboli. No right heart strain is noted. 2. No thoracic aortic aneurysm or dissection. The findings of this exam were called to Dr Rogelia at the time of exam interpretation. Electronically signed by: Oneil Devonshire MD 05/15/2024 10:40 PM EST RP Workstation: [id number]   DG Chest 2 View Result Date: 05/15/2024 EXAM: 2 VIEW(S) XRAY OF THE CHEST 05/15/2024 09:25:00 PM COMPARISON: 06/28/23 CLINICAL HISTORY: Chest pain. FINDINGS: LUNGS AND PLEURA: No focal pulmonary opacity. No pleural effusion. No pneumothorax. HEART AND MEDIASTINUM: No acute abnormality of the cardiac and mediastinal silhouettes. BONES AND SOFT TISSUES: No acute osseous abnormality. IMPRESSION: 1. No acute cardiopulmonary abnormality. Electronically signed by: Morgane Naveau MD 05/15/2024 09:39 PM EST RP Workstation: [id number]    EKG/Medicine tests: Ordered and Independent interpretation EKG Interpretation Date/Time:  Friday May 15 2024 20:15:56 EST Ventricular Rate:  83 PR Interval:  177 QRS Duration:  101 QT Interval:  360 QTC Calculation: 423 R Axis:   58  Text Interpretation: Sinus rhythm  Inferior infarct, old  Minimal ST elevation, anterior leads , similar to Dec 2018  Lateral leads are also involved         Confirmed by Rogelia Satterfield ([id number]) on 05/15/2024 10:14:47 PM  Interventions: Fentanyl , nitroglycerin , aspirin   See the EMR for full details regarding lab and imaging results.  Aspirin  has been given.  The ECG reveals no anatomical ischemia representing STEMI, New-Onset Arrhythmia, or ischemic equivalent.  He has been risk stratified with a HEAR score of 4. Initial troponin is 12; delta troponin is 13.  Patient does have established cardiologist through [facility], will refer back to  cardiology, however do not feel the patient requires admission for cardiac chest pain.  The patient's presentation, the patient being hemodynamically stable, and the ECG are not consistent with Pericardial Tamponade. The patient's pain is not positional. This in conjunction with the lack of PR depressions and ST elevations on the ECG are reassuring against Pericarditis. The patient's non-elevated troponin and ECG are also inconsistent with Myocarditis.  The CXR is unremarkable for focal airspace disease.  The patient is afebrile and denies productive cough.  Therefore, I do not suspect Pneumonia. There is no evidence of Pneumothorax on physical exam or on the CXR. CXR shows no evidence of Esophageal Tear and there is no recent intractable emesis or esophageal instrumentation. There is no peritonitis or free air on CXR worrisome for a Perforated Abdominal Viscous.  Pulmonary Embolism is on the differential. The patient is at risk via the Revised [place] Criteria. Therefore, we will further risk stratify the patient with a d-dimer.  This was elevated. Therefore, a CTA is indicated, and the radiology interpretation did note multiple subsegmental  pulmonary embolisms.  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  The patient's pain is not tearing and it does not radiate to back. Pulses are present bilaterally in both the upper and lower extremities. CXR does not show a widened mediastinum. I have a very low suspicion for Aortic Dissection.  CT abdomen and pelvis given patient did initially have diffuse abdominal pain as well as mild diffuse tenderness.  After initial pain medication, patient has resolution of symptoms.  CT of the abdomen pelvis did show possible mild pericholecystic fluid, eventually concerning for cholecystitis.  On reevaluation, patient denies any abdominal pain, does not have any abdominal  tenderness, negative Murphy sign, therefore low index of suspicion for cholecystitis, do not feel that patient requires further for cholecystitis at this time.  Lipase WNL, labs otherwise reassuring.  Given patient is now feeling back to baseline, hemodynamically stable, saturating well on room air, feel that patient is stable for discharge and outpatient follow-up.  Given recurrent pulmonary embolism, will give patient referral to hematology, first dose of Xarelto  given in the ED, Xarelto  starter pack sent to preferred pharmacy, as well as oxycodone  for breakthrough pain.  Presentation is most consistent with acute complicated illness  Discussion of management or test interpretations with external provider(s): Not indicated  Risk Drugs:Prescription drug management  Disposition: DISCHARGE: I believe that the patient is safe for discharge home with outpatient follow-up. Patient was informed of all pertinent physical exam, laboratory, and imaging findings. Patient's suspected etiology of their symptom presentation was discussed with the patient and all questions were answered. We discussed following up with PCP, hematology, cardiology. I provided thorough ED return precautions. The patient feels safe and comfortable with this plan.  MDM generated using voice dictation software and may contain dictation errors.  Please contact me for any clarification or with any questions.  Clinical Impression:  1. Multiple subsegmental pulmonary emboli without acute cor pulmonale (HCC)   2. Essential hypertension      Discharge   Final Clinical Impression(s) / ED Diagnoses Final diagnoses:  Multiple subsegmental pulmonary emboli without acute cor pulmonale (HCC)  Essential hypertension    Rx / DC Orders ED Discharge Orders          Ordered    RIVAROXABAN  (XARELTO ) VTE STARTER PACK (15 & 20 MG)  Multiple Frequencies        05/15/24 2333    oxyCODONE  (ROXICODONE ) 5 MG immediate release tablet  Every 4  hours PRN,   Status:  Discontinued        05/15/24 2333    Ambulatory referral to Cardiology        05/15/24 2337    oxyCODONE  (ROXICODONE ) 5 MG immediate release tablet  Every 6 hours PRN        05/15/24 2353    Ambulatory referral to Hematology / Oncology        05/15/24 2358             Rogelia Jerilynn RAMAN, MD 05/15/24 2359  "

## 2024-06-23 ENCOUNTER — Inpatient Hospital Stay

## 2024-06-23 ENCOUNTER — Inpatient Hospital Stay: Admitting: Hematology
